# Patient Record
Sex: Female | Born: 1975 | Race: White | Hispanic: No | Marital: Married | Smoking: Former smoker
Health system: Southern US, Community
[De-identification: ages and names within clinical notes are randomized; demographics above are authoritative.]

## PROBLEM LIST (undated history)

## (undated) DIAGNOSIS — Z8619 Personal history of other infectious and parasitic diseases: Secondary | ICD-10-CM

## (undated) DIAGNOSIS — M419 Scoliosis, unspecified: Secondary | ICD-10-CM

## (undated) DIAGNOSIS — G43909 Migraine, unspecified, not intractable, without status migrainosus: Secondary | ICD-10-CM

## (undated) DIAGNOSIS — E785 Hyperlipidemia, unspecified: Secondary | ICD-10-CM

## (undated) DIAGNOSIS — F419 Anxiety disorder, unspecified: Secondary | ICD-10-CM

## (undated) HISTORY — DX: Scoliosis, unspecified: M41.9

## (undated) HISTORY — DX: Migraine, unspecified, not intractable, without status migrainosus: G43.909

## (undated) HISTORY — DX: Anxiety disorder, unspecified: F41.9

## (undated) HISTORY — PX: OTHER SURGICAL HISTORY: SHX169

## (undated) HISTORY — DX: Hyperlipidemia, unspecified: E78.5

## (undated) HISTORY — DX: Personal history of other infectious and parasitic diseases: Z86.19

---
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
## 2006-04-19 ENCOUNTER — Inpatient Hospital Stay (HOSPITAL_COMMUNITY): Admission: AD | Admit: 2006-04-19 | Discharge: 2006-04-19 | Payer: Self-pay | Admitting: Obstetrics & Gynecology

## 2006-05-01 ENCOUNTER — Inpatient Hospital Stay (HOSPITAL_COMMUNITY): Admission: AD | Admit: 2006-05-01 | Discharge: 2006-05-03 | Payer: Self-pay | Admitting: Obstetrics & Gynecology

## 2009-03-03 ENCOUNTER — Inpatient Hospital Stay (HOSPITAL_COMMUNITY): Admission: AD | Admit: 2009-03-03 | Discharge: 2009-03-04 | Payer: Self-pay | Admitting: Obstetrics & Gynecology

## 2010-10-17 NOTE — L&D Delivery Note (Signed)
Delivery Note At 5:21 PM a healthy female was delivered via  (Presentation: Occiput anterior)  APGAR: 10/10 ; weight 7 lb 3.7 oz (3280 g).   Placenta status: complete, 3v.  No Cx.  Cord pH: not done  Anesthesia: none Episiotomy: none Lacerations: Superficial Rt vulvar Suture Repair: vicryl Est. Blood Loss (mL): 300  Mom to postpartum.  Baby to nursery-stable.  Natalyn Szymanowski,MARIE-LYNE 08/31/2011, 5:49 PM

## 2011-01-25 LAB — RH IMMUNE GLOB WKUP(>/=20WKS)(NOT WOMEN'S HOSP)

## 2011-01-25 LAB — CBC
MCHC: 35.9 g/dL (ref 30.0–36.0)
MCV: 93.3 fL (ref 78.0–100.0)
Platelets: 157 10*3/uL (ref 150–400)
RDW: 13.4 % (ref 11.5–15.5)
WBC: 11.3 10*3/uL — ABNORMAL HIGH (ref 4.0–10.5)

## 2011-01-25 LAB — RPR: RPR Ser Ql: NONREACTIVE

## 2011-08-31 ENCOUNTER — Inpatient Hospital Stay (HOSPITAL_COMMUNITY)
Admission: AD | Admit: 2011-08-31 | Discharge: 2011-09-01 | DRG: 373 | Disposition: A | Discharge status: Home / Self Care | Payer: BC Managed Care – PPO | Source: Ambulatory Visit | Attending: Obstetrics & Gynecology | Admitting: Obstetrics & Gynecology

## 2011-08-31 ENCOUNTER — Encounter (HOSPITAL_COMMUNITY): Payer: Self-pay | Admitting: *Deleted

## 2011-08-31 LAB — CBC
HCT: 37.6 % (ref 36.0–46.0)
MCH: 30.9 pg (ref 26.0–34.0)
MCV: 90.2 fL (ref 78.0–100.0)
Platelets: 192 10*3/uL (ref 150–400)
RDW: 13.9 % (ref 11.5–15.5)
WBC: 10.6 10*3/uL — ABNORMAL HIGH (ref 4.0–10.5)

## 2011-08-31 LAB — STREP B DNA PROBE: GBS: NEGATIVE

## 2011-08-31 LAB — HIV ANTIBODY (ROUTINE TESTING W REFLEX): HIV: NONREACTIVE

## 2011-08-31 LAB — RUBELLA ANTIBODY, IGM: Rubella: IMMUNE

## 2011-08-31 MED ORDER — DIPHENHYDRAMINE HCL 25 MG PO CAPS: 25.0000 mg | ORAL_CAPSULE | Freq: Four times a day (QID) | ORAL | Status: DC | PRN

## 2011-08-31 MED ORDER — ZOLPIDEM TARTRATE 5 MG PO TABS: 5.0000 mg | ORAL_TABLET | Freq: Every evening | ORAL | Status: DC | PRN

## 2011-08-31 MED ORDER — IBUPROFEN 600 MG PO TABS
600.0000 mg | ORAL_TABLET | Freq: Four times a day (QID) | ORAL | Status: DC
  Administered 2011-08-31 – 2011-09-01 (×4): 600 mg via ORAL
  Filled 2011-08-31 (×4): qty 1

## 2011-08-31 MED ORDER — LIDOCAINE HCL (PF) 1 % IJ SOLN
INTRAMUSCULAR | Status: AC
  Filled 2011-08-31: qty 30

## 2011-08-31 MED ORDER — LACTATED RINGERS IV SOLN: INTRAVENOUS | Status: DC

## 2011-08-31 MED ORDER — LIDOCAINE HCL (PF) 1 % IJ SOLN: 30.0000 mL | INTRAMUSCULAR | Status: DC | PRN

## 2011-08-31 MED ORDER — SENNOSIDES-DOCUSATE SODIUM 8.6-50 MG PO TABS
2.0000 | ORAL_TABLET | Freq: Every day | ORAL | Status: DC
  Administered 2011-08-31: 2 via ORAL

## 2011-08-31 MED ORDER — WITCH HAZEL-GLYCERIN EX PADS: 1.0000 "application " | MEDICATED_PAD | CUTANEOUS | Status: DC | PRN

## 2011-08-31 MED ORDER — ONDANSETRON HCL 4 MG PO TABS: 4.0000 mg | ORAL_TABLET | ORAL | Status: DC | PRN

## 2011-08-31 MED ORDER — FLEET ENEMA 7-19 GM/118ML RE ENEM: 1.0000 | ENEMA | RECTAL | Status: DC | PRN

## 2011-08-31 MED ORDER — LACTATED RINGERS IV SOLN
500.0000 mL | INTRAVENOUS | Status: DC | PRN
Start: 2011-08-31 — End: 2011-08-31

## 2011-08-31 MED ORDER — LANOLIN HYDROUS EX OINT: TOPICAL_OINTMENT | CUTANEOUS | Status: DC | PRN

## 2011-08-31 MED ORDER — TERBUTALINE SULFATE 1 MG/ML IJ SOLN: 0.2500 mg | Freq: Once | INTRAMUSCULAR | Status: DC | PRN

## 2011-08-31 MED ORDER — CITRIC ACID-SODIUM CITRATE 334-500 MG/5ML PO SOLN: 30.0000 mL | ORAL | Status: DC | PRN

## 2011-08-31 MED ORDER — ONDANSETRON HCL 4 MG/2ML IJ SOLN: 4.0000 mg | INTRAMUSCULAR | Status: DC | PRN

## 2011-08-31 MED ORDER — OXYTOCIN 20 UNITS IN LACTATED RINGERS INFUSION - SIMPLE
INTRAVENOUS | Status: AC
  Filled 2011-08-31: qty 1000

## 2011-08-31 MED ORDER — PRENATAL PLUS 27-1 MG PO TABS
1.0000 | ORAL_TABLET | Freq: Every day | ORAL | Status: DC
  Administered 2011-09-01: 1 via ORAL
  Filled 2011-08-31: qty 1

## 2011-08-31 MED ORDER — OXYCODONE-ACETAMINOPHEN 5-325 MG PO TABS: 2.0000 | ORAL_TABLET | ORAL | Status: DC | PRN

## 2011-08-31 MED ORDER — ONDANSETRON HCL 4 MG/2ML IJ SOLN: 4.0000 mg | Freq: Four times a day (QID) | INTRAMUSCULAR | Status: DC | PRN

## 2011-08-31 MED ORDER — DIBUCAINE 1 % RE OINT: 1.0000 "application " | TOPICAL_OINTMENT | RECTAL | Status: DC | PRN

## 2011-08-31 MED ORDER — OXYTOCIN 20 UNITS IN LACTATED RINGERS INFUSION - SIMPLE
1.0000 m[IU]/min | INTRAVENOUS | Status: DC
  Administered 2011-08-31: 2 m[IU]/min via INTRAVENOUS

## 2011-08-31 MED ORDER — BENZOCAINE-MENTHOL 20-0.5 % EX AERO
1.0000 "application " | INHALATION_SPRAY | CUTANEOUS | Status: DC | PRN
  Administered 2011-08-31: 1 via TOPICAL

## 2011-08-31 MED ORDER — BENZOCAINE-MENTHOL 20-0.5 % EX AERO
INHALATION_SPRAY | CUTANEOUS | Status: AC
  Administered 2011-08-31: 1 via TOPICAL
  Filled 2011-08-31: qty 56

## 2011-08-31 MED ORDER — OXYTOCIN 20 UNITS IN LACTATED RINGERS INFUSION - SIMPLE: 125.0000 mL/h | Freq: Once | INTRAVENOUS | Status: DC

## 2011-08-31 MED ORDER — SIMETHICONE 80 MG PO CHEW: 80.0000 mg | CHEWABLE_TABLET | ORAL | Status: DC | PRN

## 2011-08-31 MED ORDER — OXYCODONE-ACETAMINOPHEN 5-325 MG PO TABS
1.0000 | ORAL_TABLET | ORAL | Status: DC | PRN
Start: 2011-08-31 — End: 2011-09-01
  Administered 2011-09-01 (×3): 1 via ORAL
  Filled 2011-08-31 (×3): qty 1

## 2011-08-31 MED ORDER — ACETAMINOPHEN 325 MG PO TABS: 650.0000 mg | ORAL_TABLET | ORAL | Status: DC | PRN

## 2011-08-31 MED ORDER — IBUPROFEN 600 MG PO TABS: 600.0000 mg | ORAL_TABLET | Freq: Four times a day (QID) | ORAL | Status: DC | PRN

## 2011-08-31 MED ORDER — TETANUS-DIPHTH-ACELL PERTUSSIS 5-2.5-18.5 LF-MCG/0.5 IM SUSP
0.5000 mL | Freq: Once | INTRAMUSCULAR | Status: AC
  Administered 2011-09-01: 0.5 mL via INTRAMUSCULAR
  Filled 2011-08-31: qty 0.5

## 2011-08-31 MED ORDER — OXYTOCIN BOLUS FROM INFUSION
500.0000 mL | Freq: Once | INTRAVENOUS | Status: DC
  Filled 2011-08-31: qty 500

## 2011-08-31 NOTE — Progress Notes (Signed)
Subjective: Doing well, pain tolerated, UCs q 3 min with Pito  Anesthesia none   Objective: BP 125/83  Pulse 105  Temp(Src) 98.6 F (37 C) (Oral)  Resp 18  Ht 5\' 3"  (1.6 m)  Wt 63.05 kg (139 lb)  BMI 24.62 kg/m2   FHT:  FHR: 140 bpm, variability: moderate,  accelerations:  Present,  decelerations:  Absent UC:   regular, every 3 minutes VE:   Dilation: 6 Effacement (%): 90 Station: 0 Exam by:: Montez Morita, RNC Reexamined now:  Unchanged.  AROM moderate clear AF.   Assessment / Plan: Induction of labor due to advanced dilatation with h/o expedite delivery and sciatalgia,  progressing well on pitocin  Fetal Wellbeing:  Category I Pain Control:  Labor support without medications  Anticipated MOD:  NSVD  Jessica Acosta,Jessica Acosta 08/31/2011, 4:53 PM

## 2011-08-31 NOTE — H&P (Signed)
Jessica Acosta is a 35 y.o. female G1P0 [redacted]w[redacted]d presenting for Induction.   RP:  Advanced cervical dilatation/h/o expedite delivery/sciatalgia  OB History    Grav Para Term Preterm Abortions TAB SAB Ect Mult Living   1              No past medical history on file. No past surgical history on file. Family History: family history is not on file. Social History:  does not have a smoking history on file. She does not have any smokeless tobacco history on file. Her alcohol and drug histories not on file. Current facility-administered medications:oxytocin (PITOCIN) IV infusion 20 units in LR 1000 mL, 1-40 milli-units/min, Intravenous, Titrated, [place] Bonna Steury;  terbutaline (BRETHINE) injection 0.25 mg, 0.25 mg, Subcutaneous, Once PRN, [place] Zae Kirtz No Known Allergies    Blood pressure 109/72, pulse 90, temperature 97.8 F (36.6 C), temperature source Oral, resp. rate 18, height 5\' 3"  (1.6 m), weight 63.05 kg (139 lb). VE 4 cm/90%/Vtx/0 intact  HPP: There is no problem list on file for this patient.   Prenatal labs: ABO, Rh: A/Negative/-- (11/14 0000) Antibody: Negative (11/14 0000) Rubella:   RPR: Nonreactive (11/14 0000)  HBsAg: Negative (11/14 0000)  HIV: Non-reactive (11/14 0000)  Genetic testing: Ultrascreen wnl [place] anato: wnl 1 hr GTT: wnl GBS: Negative (11/14 0000)   Assessment/Plan: 39+ wks induction low dose Pitocin for advanced dilatation/expedite delivery in G2/sciatalgia    Jessica Acosta,MARIE-LYNE 08/31/2011, 2:48 PM

## 2011-09-01 LAB — CBC
Platelets: 148 10*3/uL — ABNORMAL LOW (ref 150–400)
RBC: 3.48 MIL/uL — ABNORMAL LOW (ref 3.87–5.11)
WBC: 11.8 10*3/uL — ABNORMAL HIGH (ref 4.0–10.5)

## 2011-09-01 MED ORDER — IBUPROFEN 600 MG PO TABS: 600.0000 mg | ORAL_TABLET | Freq: Four times a day (QID) | ORAL | Status: AC

## 2011-09-01 MED ORDER — OXYCODONE-ACETAMINOPHEN 5-325 MG PO TABS: 1.0000 | ORAL_TABLET | ORAL | Status: AC | PRN

## 2011-09-01 NOTE — Discharge Summary (Signed)
Obstetric Discharge Summary Reason for Admission: onset of labor Prenatal Procedures: none Intrapartum Procedures: spontaneous vaginal delivery Postpartum Procedures: none Complications-Operative and Postpartum: none Hemoglobin  Date Value Range Status  09/01/2011 10.8* 12.0-15.0 (g/dL) Final     HCT  Date Value Range Status  09/01/2011 31.4* 36.0-46.0 (%) Final    Discharge Diagnoses: Term Pregnancy-delivered  Discharge Information: Date: 09/01/2011 Activity: pelvic rest Diet: routine Medications: PNV, Ibuprofen and Percocet Condition: stable Instructions: refer to practice specific booklet Discharge to: home Follow-up Information    Follow up with LAVOIE,MARIE-LYNE. Make an appointment in 6 weeks.   Contact information:   [address] [place] [address] [phone]          Newborn Data: Live born female  Birth Weight: 7 lb 3.7 oz (3280 g) APGAR: 9, 10  Home with mother.  Tiras Bianchini 09/01/2011, 9:52 AM

## 2011-09-01 NOTE — Progress Notes (Signed)
  PPD 1 SVD  S:  Reports feeling well / minimal cramps - desires early discharge today             Tolerating po/ No nausea or vomiting             Bleeding is light             Pain controlled withprescription NSAID's including motrin and percocet             Up ad lib / ambulatory  Newborn breast feeding  / female / Circumcision - not planned   O:  A & O x 3 NAD             VS: Blood pressure 95/54, pulse 78, temperature 98.3 F (36.8 C), temperature source Oral, resp. rate 18, height 5\' 3"  (1.6 m), weight 63.05 kg (139 lb), unknown if currently breastfeeding.  LABS: Lab Results  Component Value Date   WBC 11.8* 09/01/2011   HGB 10.8* 09/01/2011   HCT 31.4* 09/01/2011   MCV 90.2 09/01/2011   PLT 148* 09/01/2011     Lungs: Clear and unlabored  Heart: regular rate and rhythm / no mumurs  Abdomen: soft, non-tender, non-distended              Fundus: firm, non-tender, Ueven  Perineum: mild edema  Lochia: light  Extremities: no edema, no calf pain or tenderness    A: PPD # 1 SVD   Doing well - stable status  P:  Routine post partum orders  Early discharge home at 24 hours post-birth             Rhogam prior to discharge as indicated  Jessica Acosta 09/01/2011, 9:48 AM

## 2012-11-16 ENCOUNTER — Encounter: Payer: Self-pay | Admitting: Family Medicine

## 2012-11-16 ENCOUNTER — Ambulatory Visit (INDEPENDENT_AMBULATORY_CARE_PROVIDER_SITE_OTHER): Payer: BC Managed Care – PPO | Admitting: Family Medicine

## 2012-11-16 ENCOUNTER — Ambulatory Visit (INDEPENDENT_AMBULATORY_CARE_PROVIDER_SITE_OTHER)
Admission: RE | Admit: 2012-11-16 | Discharge: 2012-11-16 | Disposition: A | Discharge status: Home / Self Care | Payer: BC Managed Care – PPO | Source: Ambulatory Visit | Attending: Family Medicine | Admitting: Family Medicine

## 2012-11-16 VITALS — BP 118/80 | HR 62 | Temp 98.2°F | Ht 62.25 in | Wt 112.2 lb

## 2012-11-16 DIAGNOSIS — R109 Unspecified abdominal pain: Secondary | ICD-10-CM

## 2012-11-16 DIAGNOSIS — M545 Low back pain, unspecified: Secondary | ICD-10-CM

## 2012-11-16 DIAGNOSIS — Z23 Encounter for immunization: Secondary | ICD-10-CM

## 2012-11-16 LAB — COMPREHENSIVE METABOLIC PANEL
ALT: 18 U/L (ref 0–35)
AST: 12 U/L (ref 0–37)
Albumin: 4 g/dL (ref 3.5–5.2)
Alkaline Phosphatase: 32 U/L — ABNORMAL LOW (ref 39–117)
Calcium: 9 mg/dL (ref 8.4–10.5)
Chloride: 105 mEq/L (ref 96–112)
Creatinine, Ser: 0.7 mg/dL (ref 0.4–1.2)
Potassium: 4.4 mEq/L (ref 3.5–5.1)

## 2012-11-16 LAB — CBC WITH DIFFERENTIAL/PLATELET
Basophils Relative: 0.4 % (ref 0.0–3.0)
Eosinophils Absolute: 0 10*3/uL (ref 0.0–0.7)
Eosinophils Relative: 0.3 % (ref 0.0–5.0)
HCT: 38.5 % (ref 36.0–46.0)
Lymphs Abs: 2.5 10*3/uL (ref 0.7–4.0)
MCHC: 34.5 g/dL (ref 30.0–36.0)
MCV: 86.4 fl (ref 78.0–100.0)
Monocytes Absolute: 0.4 10*3/uL (ref 0.1–1.0)
Neutrophils Relative %: 57.6 % (ref 43.0–77.0)
RBC: 4.45 Mil/uL (ref 3.87–5.11)

## 2012-11-16 LAB — POCT URINALYSIS DIPSTICK
Bilirubin, UA: NEGATIVE
Glucose, UA: NEGATIVE
Leukocytes, UA: NEGATIVE
Nitrite, UA: NEGATIVE
Urobilinogen, UA: 0.2

## 2012-11-16 MED ORDER — NAPROXEN 500 MG PO TABS
ORAL_TABLET | ORAL | Status: DC
Start: 2012-11-16 — End: 2017-11-14

## 2012-11-16 NOTE — Assessment & Plan Note (Addendum)
With associated lower back pain UA - 1+ blood but micro with 0-2 RBC. ?kidney stones. With RUQ pain - ?gallstone issue. ?ovarian cyst. Check KUB today  Check blood work today to eval kidney function, liver, and white count. Treat pain with prescription NSAID for next week.  Benign exam today.  If any worsening, low threshold to obtain CT abd.

## 2012-11-16 NOTE — Patient Instructions (Addendum)
Flu shot today. I wonder if you have kidney stone issue versus gallstone. Treat with straining urine (strainer provided today) and anti inflammatory for pain as needed - take with food so it doesn't upset stomach. If not improving by next week, or if any worsening, let me know for CT scan. If spotting not improving, may need to return to Dr. Seymour Bars for recheck on new birth control pill. Good to meet you, call us with questions.

## 2012-11-16 NOTE — Progress Notes (Signed)
Subjective:    Patient ID: Jessica Acosta, female    DOB: 01-04-1976, 37 y.o.   MRN: [id number]  HPI CC: new pt to establish   Wife of [name]. OBGYN - Dr. Seymour Bars.  1 wk h/o lower back pain and mid abdominal pain   Lower back pain continues, described as continuous dull throbbing ache.  Last night with sharp stabbing pains in abdomen.  Pain not associated with BMs.  No pelvic pain.  Pain worse after eating large meal, but no specific food type aggravates this pain.  Mild nausea with this.  Some bloating associated with this.    H/o bad heartburn with pregnancies, controlled with tums.   No h/o kidney stones.  No fevers/chills, vomiting, diarrhea, constipation, indigestion, heartburn.  No urinary sxs of dysuria, urgency or frequency.  Normal BMs,  Hasn't tried anything for this.  H/o scoliosis On OCP - orsythia - for last 3 months.  Started spotting several days ago - thinks due to recent change in OCP (prior on mini-pill).  LMP 10/22/2012. 3 children at home, youngest child is 68 yo.  Both parents had gallbladder removed.  Lives with husband Latanja Lehenbauer and 3 children, 1 dog, 1 cat Occupation: elem school teacher Edu: master's degree Activity: no regular exercise Diet: good water, fruits/vegetables daily  Preventative: OBGYN - Dr. Seymour Bars.  Pap 10/19/2012.  H/o abnormal x1 around 1990s Tdap 2013 Flu - requests flu shot today.  Medications and allergies reviewed and updated in chart.  Past histories reviewed and updated if relevant as below. Patient Active Problem List  Diagnosis  . Postpartum care following vaginal delivery (11/14)   Past Medical History  Diagnosis Date  . History of chicken pox   . Scoliosis    Past Surgical History  Procedure Date  . Wisdom teeth removal   . Mole removed from ear 1987   History  Substance Use Topics  . Smoking status: Former Smoker    Quit date: 11/17/2003  . Smokeless tobacco: Never Used  . Alcohol Use: Yes   Comment: 2 glasses wine occasionally   Family History  Problem Relation Age of Onset  . Heart disease Father     pacer  . Hypertension Father   . Hyperlipidemia Father   . Arthritis Maternal Grandmother   . CAD Maternal Grandfather 51    MI (smoker)  . Diabetes Maternal Grandfather   . Cancer Paternal Grandmother     breast  . Stroke Paternal Grandmother   . Heart disease Paternal Grandmother     CHF  . Cancer Paternal Grandfather     prostate   No Known Allergies Current Outpatient Prescriptions on File Prior to Visit  Medication Sig Dispense Refill  . ORSYTHIA 0.1-20 MG-MCG tablet Take 1 tablet by mouth Daily.         Review of Systems  Constitutional: Negative for fever, chills, activity change, appetite change, fatigue and unexpected weight change.  HENT: Negative for hearing loss and neck pain.   Eyes: Negative for visual disturbance.  Respiratory: Negative for cough, shortness of breath and wheezing.   Cardiovascular: Negative for chest pain, palpitations and leg swelling.  Gastrointestinal: Positive for nausea and abdominal pain. Negative for vomiting, diarrhea, constipation, blood in stool and abdominal distention.  Genitourinary: Negative for hematuria and difficulty urinating.  Musculoskeletal: Negative for myalgias and arthralgias.  Skin: Negative for rash.  Neurological: Negative for dizziness, seizures, syncope and headaches.  Hematological: Does not bruise/bleed easily.  Psychiatric/Behavioral: Negative for dysphoric  mood. The patient is not nervous/anxious.        Objective:   Physical Exam  Nursing note and vitals reviewed. Constitutional: She is oriented to person, place, and time. She appears well-developed and well-nourished. No distress.  HENT:  Head: Normocephalic and atraumatic.  Right Ear: External ear normal.  Left Ear: External ear normal.  Nose: Nose normal.  Mouth/Throat: Oropharynx is clear and moist. No oropharyngeal exudate.  Eyes:  Conjunctivae normal and EOM are normal. Pupils are equal, round, and reactive to light. No scleral icterus.  Neck: Normal range of motion. Neck supple.  Cardiovascular: Normal rate, regular rhythm, normal heart sounds and intact distal pulses.   No murmur heard. Pulses:      Radial pulses are 2+ on the right side, and 2+ on the left side.  Pulmonary/Chest: Effort normal and breath sounds normal. No respiratory distress. She has no wheezes. She has no rales.  Abdominal: Soft. Normal appearance. She exhibits no distension and no mass. Bowel sounds are increased. There is no hepatosplenomegaly. There is tenderness (mild) in the right upper quadrant. There is no rigidity, no rebound, no guarding, no CVA tenderness and negative Murphy's sign. No hernia.       Mild RUQ discomfort to palpation  Musculoskeletal: Normal range of motion. She exhibits no edema.       Mild L lumbar paraspinous mm tightness No midline spine tenderness. Neg SLR bilaterally, neg faber, no pain with int/ext rotation at hips  Lymphadenopathy:    She has no cervical adenopathy.  Neurological: She is alert and oriented to person, place, and time.       CN grossly intact, station and gait intact  Skin: Skin is warm and dry. No rash noted.  Psychiatric: She has a normal mood and affect. Her behavior is normal. Judgment and thought content normal.       Assessment & Plan:

## 2012-11-19 ENCOUNTER — Encounter: Payer: Self-pay | Admitting: *Deleted

## 2012-11-26 ENCOUNTER — Telehealth: Payer: Self-pay

## 2012-11-26 NOTE — Telephone Encounter (Signed)
Pt called back and left v/m; pt doing better;pt took Aleve and when strained urine did see small pieces size of salt or sand in strainer(too small to have analyzed). Pt has no more abdominal or back pain.Please advise.

## 2012-11-26 NOTE — Telephone Encounter (Signed)
Pt left v/m recently saw Dr Reece Agar and was to call back with update on pt condition; pt said feeling better but request call back for pt to give more detail. Left v/m for pt to call back.

## 2012-11-27 NOTE — Telephone Encounter (Signed)
Noted. I'm glad she's feeling better. Would just monitor for now, push plenty of fluids and notify me if back pain returning.

## 2012-11-27 NOTE — Telephone Encounter (Signed)
Patient notified

## 2012-12-15 LAB — HM COLONOSCOPY: HM Colonoscopy: NORMAL

## 2013-01-06 ENCOUNTER — Encounter: Payer: Self-pay | Admitting: Family Medicine

## 2013-03-13 ENCOUNTER — Telehealth: Payer: Self-pay | Admitting: Family Medicine

## 2013-03-13 ENCOUNTER — Encounter: Payer: Self-pay | Admitting: Family Medicine

## 2013-03-13 ENCOUNTER — Ambulatory Visit (INDEPENDENT_AMBULATORY_CARE_PROVIDER_SITE_OTHER): Payer: BC Managed Care – PPO | Admitting: Family Medicine

## 2013-03-13 VITALS — BP 100/64 | HR 85 | Temp 98.1°F | Ht 62.25 in | Wt 116.0 lb

## 2013-03-13 DIAGNOSIS — J0100 Acute maxillary sinusitis, unspecified: Secondary | ICD-10-CM

## 2013-03-13 MED ORDER — AMOXICILLIN-POT CLAVULANATE 875-125 MG PO TABS: 1.0000 | ORAL_TABLET | Freq: Two times a day (BID) | ORAL | Status: DC

## 2013-03-13 NOTE — Telephone Encounter (Signed)
Patient Information:  Caller Name: [name]  Phone: [phone]  Patient: Jessica Acosta  Gender: Female  DOB: 11/10/1975  Age: 37 Years  PCP: Eustaquio Boyden [facility])  Pregnant: No  Office Follow Up:  Does the office need to follow up with this patient?: No  Instructions For The Office: N/A   Symptoms  Reason For Call & Symptoms: Pt states she has sinus pressure and congestion mostly oin the right side.  Reviewed Health History In EMR: Yes  Reviewed Medications In EMR: Yes  Reviewed Allergies In EMR: Yes  Reviewed Surgeries / Procedures: Yes  Date of Onset of Symptoms: 03/03/2013 OB / GYN:  LMP: 03/11/2013  Guideline(s) Used:  Sinus Pain and Congestion  Disposition Per Guideline:   Go to Office Now  Reason For Disposition Reached:   Redness or swelling on the cheek, forehead, or around the eye  Advice Given:  Call Back If:   You become worse.  Patient Will Follow Care Advice:  YES  Appointment Scheduled:  03/13/2013 14:30:00 Appointment Scheduled Provider:  Hannah Beat [facility])

## 2013-03-13 NOTE — Progress Notes (Signed)
   Nature conservation officer at [facility] [address] [place] [address] Phone: [phone] Fax: [phone]  Date:  03/13/2013   Name:  Jessica Acosta   DOB:  October 25, 1975   MRN:  [id number] Gender: female Age: 37 y.o.  Primary Physician:  Eustaquio Boyden, MD  Evaluating MD: Hannah Beat, MD  Chief Complaint: Facial Pain   History of Present Illness:  Jessica Acosta is a 37 y.o. very pleasant female patient who presents with the following:  Last weekend, started with some congestion and had a lot of stuffiness in her head. Felt miserable last Monday and got a fever, and all day on Tuesday and felt pretty bad. Tues evening, wanted to see how she was doing and broke on wed. Still had some congestion. Friday and Sat. Started to get achiness on her jaw.   Monday, was really bad and could hardly chew on the right side. Had a pulp cap put on, too.   Past Medical History, Surgical History, Social History, Family History, Problem List, Medications, and Allergies have been reviewed and updated if relevant.  Current Outpatient Prescriptions on File Prior to Visit  Medication Sig Dispense Refill  . naproxen (NAPROSYN) 500 MG tablet Take one po bid x 1 week then prn pain, take with food  60 tablet  0  . ORSYTHIA 0.1-20 MG-MCG tablet Take 1 tablet by mouth Daily.      . Probiotic Product (PROBIOTIC PO) Take by mouth. As directed       No current facility-administered medications on file prior to visit.    Review of Systems: ROS: GEN: Acute illness details above GI: Tolerating PO intake GU: maintaining adequate hydration and urination Pulm: No SOB Interactive and getting along well at home.  Otherwise, ROS is as per the HPI.   Physical Examination: BP 100/64  Pulse 85  Temp(Src) 98.1 F (36.7 C) (Oral)  Ht 5' 2.25" (1.581 m)  Wt 116 lb (52.617 kg)  BMI 21.05 kg/m2  SpO2 97%   Gen: WDWN, NAD; alert,appropriate and cooperative throughout exam  HEENT:  Normocephalic and atraumatic. Throat clear, w/o exudate, no LAD, R TM clear, L TM - good landmarks, No fluid present. rhinnorhea.  Left frontal and maxillary sinuses: nt, mild at l max Right frontal and maxillary sinuses: Tender at max  Neck: No ant or post LAD CV: RRR, No M/G/R Pulm: Breathing comfortably in no resp distress. no w/c/r Abd: S,NT,ND,+BS Extr: no c/c/e Psych: full affect, pleasant   Assessment and Plan:  Acute maxillary sinusitis Acute sinusitis: ABX as below. .  Reviewed symptomatic care as well as ABX in this case.     Orders Today:  No orders of the defined types were placed in this encounter.    Updated Medication List: (Includes new medications, updates to list, dose adjustments) Meds ordered this encounter  Medications  . amoxicillin-clavulanate (AUGMENTIN) 875-125 MG per tablet    Sig: Take 1 tablet by mouth 2 (two) times daily.    Dispense:  20 tablet    Refill:  0    Medications Discontinued: There are no discontinued medications.    Signed, Elpidio Galea. Curran Lenderman, MD 03/13/2013 2:43 PM

## 2013-11-28 ENCOUNTER — Encounter: Payer: Self-pay | Admitting: Family Medicine

## 2013-11-28 ENCOUNTER — Ambulatory Visit (INDEPENDENT_AMBULATORY_CARE_PROVIDER_SITE_OTHER): Payer: BC Managed Care – PPO | Admitting: Family Medicine

## 2013-11-28 VITALS — BP 118/78 | HR 86 | Temp 98.1°F | Ht 62.25 in | Wt 122.5 lb

## 2013-11-28 DIAGNOSIS — Z23 Encounter for immunization: Secondary | ICD-10-CM

## 2013-11-28 DIAGNOSIS — M25519 Pain in unspecified shoulder: Secondary | ICD-10-CM

## 2013-11-28 DIAGNOSIS — Z0000 Encounter for general adult medical examination without abnormal findings: Secondary | ICD-10-CM | POA: Insufficient documentation

## 2013-11-28 DIAGNOSIS — M25511 Pain in right shoulder: Secondary | ICD-10-CM

## 2013-11-28 NOTE — Progress Notes (Signed)
BP 118/78  Pulse 86  Temp(Src) 98.1 F (36.7 C) (Oral)  Ht 5' 2.25" (1.581 m)  Wt 122 lb 8 oz (55.566 kg)  BMI 22.23 kg/m2  LMP 10/22/2013   CC: annual exam, R shoulder pain  Subjective:    Patient ID: Jessica BakerKaren G Mcewen, female    DOB: 28-Aug-1976, 38 y.o.   MRN: [id number]  HPI: Jessica Acosta is a 38 y.o. female presenting on 11/28/2013 with Annual Exam and Shoulder Pain  R shoulder pain - Describes R shoulder pain not completely resolved with NSAID.  Ongoing for last month.  Worse with certain movements ie overhead.  Denies inciting trauma/falls.  Taking naprosyn 500mg  prn.  Some shooting pain down arm to hand.  Has tried icy hot, massage, ice, heating pad.  + h/o paresthesias of bilateral hands worse in the winter.  Lives with husband Lesleigh NoeRicky Blomgren and 3 children, 1 dog, 1 cat  Occupation: elem school teacher  Edu: master's degree  Activity: no regular exercise  Diet: good water, fruits/vegetables daily   Seat belt use discussed Sunscreen use discussed  Preventative:  OBGYN - Dr. Seymour BarsLavoie. Next appt 12/10/2012.  Last well woman was 1 yr ago.  H/o abnormal pap x1 around 1990s  LMP - spotting late last month.  Pregnancy test this week was normal. Tdap 2013  Flu - requests flu shot today.   Relevant past medical, surgical, family and social history reviewed and updated. Allergies and medications reviewed and updated. Current Outpatient Prescriptions on File Prior to Visit  Medication Sig  . naproxen (NAPROSYN) 500 MG tablet Take one po bid x 1 week then prn pain, take with food  . ORSYTHIA 0.1-20 MG-MCG tablet Take 1 tablet by mouth Daily.  . Probiotic Product (PROBIOTIC PO) Take by mouth. As directed   No current facility-administered medications on file prior to visit.    Review of Systems  Constitutional: Negative for fever, chills, activity change, appetite change, fatigue and unexpected weight change.  HENT: Negative for hearing loss.   Eyes: Negative for  visual disturbance.  Respiratory: Negative for cough, chest tightness, shortness of breath and wheezing.   Cardiovascular: Negative for chest pain, palpitations and leg swelling.  Gastrointestinal: Negative for nausea, vomiting, abdominal pain, diarrhea, constipation, blood in stool and abdominal distention.  Genitourinary: Negative for hematuria and difficulty urinating.  Musculoskeletal: Negative for arthralgias, myalgias and neck pain.  Skin: Negative for rash.  Neurological: Negative for dizziness, seizures, syncope and headaches.  Hematological: Negative for adenopathy. Does not bruise/bleed easily.  Psychiatric/Behavioral: Negative for dysphoric mood. The patient is nervous/anxious (mild).    Per HPI unless specifically indicated above    Objective:    BP 118/78  Pulse 86  Temp(Src) 98.1 F (36.7 C) (Oral)  Ht 5' 2.25" (1.581 m)  Wt 122 lb 8 oz (55.566 kg)  BMI 22.23 kg/m2  LMP 10/22/2013  Physical Exam  Nursing note and vitals reviewed. Constitutional: She is oriented to person, place, and time. She appears well-developed and well-nourished. No distress.  HENT:  Head: Normocephalic and atraumatic.  Right Ear: Hearing, tympanic membrane, external ear and ear canal normal.  Left Ear: Hearing, tympanic membrane, external ear and ear canal normal.  Nose: Nose normal.  Mouth/Throat: Uvula is midline, oropharynx is clear and moist and mucous membranes are normal. No oropharyngeal exudate, posterior oropharyngeal edema or posterior oropharyngeal erythema.  Eyes: Conjunctivae and EOM are normal. Pupils are equal, round, and reactive to light. No scleral icterus.  Neck: Normal  range of motion. Neck supple. No thyromegaly present.  Cardiovascular: Normal rate, regular rhythm, normal heart sounds and intact distal pulses.   No murmur heard. Pulses:      Radial pulses are 2+ on the right side, and 2+ on the left side.  Pulmonary/Chest: Effort normal and breath sounds normal. No  respiratory distress. She has no wheezes. She has no rales.  Abdominal: Soft. Bowel sounds are normal. She exhibits no distension and no mass. There is no tenderness. There is no rebound and no guarding.  Genitourinary:  Deferred - per OBGYN  Musculoskeletal: Normal range of motion. She exhibits no edema.  FROM at neck and shoulder. No midline pain at cervical spine.  No deformity, no pain to palpation of shoulders bilaterally No significant pain with int/ext rotation at shoulder + pain with empty can test on right. + impingement No significant pain with rotation of humeral head in GH joint. Neg speed test.  Lymphadenopathy:    She has no cervical adenopathy.  Neurological: She is alert and oriented to person, place, and time.  CN grossly intact, station and gait intact  Skin: Skin is warm and dry. No rash noted.  Psychiatric: She has a normal mood and affect. Her behavior is normal. Judgment and thought content normal.      Assessment & Plan:   Problem List Items Addressed This Visit   Health care maintenance - Primary     Preventative protocols reviewed and updated unless pt declined. Discussed healthy diet and lifestyle. FLP, BMP, and TSH today. Flu shot today. Pt wil schedule appt with OBGYN for well woman exam.    Relevant Orders      Lipid panel      Basic metabolic panel      TSH   Right shoulder pain     Anticipate rotator cuff syndrome with possible impingement Will treat with ibuprofen, stretching exercises from SM pt advisor, and ice/heat.  If not improved, will refer to PT and SM MD. Pt agrees with plan.        Follow up plan: Return if symptoms worsen or fail to improve.

## 2013-11-28 NOTE — Addendum Note (Signed)
Addended by: Josph MachoANCE, Nayelie Gionfriddo A on: 11/28/2013 05:09 PM   Modules accepted: Orders

## 2013-11-28 NOTE — Assessment & Plan Note (Signed)
Anticipate rotator cuff syndrome with possible impingement Will treat with ibuprofen, stretching exercises from SM pt advisor, and ice/heat.  If not improved, will refer to PT and SM MD. Pt agrees with plan.

## 2013-11-28 NOTE — Patient Instructions (Addendum)
Flu shot today. Look at raynaud's syndrome. Blood work today - fasting. For R shoulder - I think you have injury to your rotator cuff on the right leading to inflammation.  Start strengthening exercises (provided today) as well as continue ibuprofen 600-800mg  up to three times daily with meals as needed.  If not helping, let me know for referral to physical therapy and sports medicine doctor. Good to see you today, call us with questions.

## 2013-11-28 NOTE — Assessment & Plan Note (Signed)
Preventative protocols reviewed and updated unless pt declined. Discussed healthy diet and lifestyle. FLP, BMP, and TSH today. Flu shot today. Pt wil schedule appt with OBGYN for well woman exam.

## 2013-11-29 LAB — BASIC METABOLIC PANEL
BUN: 12 mg/dL (ref 6–23)
CHLORIDE: 103 meq/L (ref 96–112)
CO2: 23 mEq/L (ref 19–32)
CREATININE: 0.6 mg/dL (ref 0.4–1.2)
Calcium: 9.6 mg/dL (ref 8.4–10.5)
GFR: 113.67 mL/min (ref >60.00)
Glucose, Bld: 81 mg/dL (ref 70–99)
Potassium: 4.4 mEq/L (ref 3.5–5.1)
Sodium: 137 mEq/L (ref 135–145)

## 2013-11-29 LAB — LDL CHOLESTEROL, DIRECT: Direct LDL: 148.1 mg/dL

## 2013-11-29 LAB — TSH: TSH: 1.93 u[IU]/mL (ref 0.35–5.50)

## 2013-11-29 LAB — LIPID PANEL
CHOLESTEROL: 232 mg/dL — AB (ref 0–200)
HDL: 68.6 mg/dL (ref >39.00)
Total CHOL/HDL Ratio: 3
Triglycerides: 90 mg/dL (ref 0.0–149.0)
VLDL: 18 mg/dL (ref 0.0–40.0)

## 2013-12-02 ENCOUNTER — Encounter: Payer: Self-pay | Admitting: *Deleted

## 2014-08-18 ENCOUNTER — Encounter: Payer: Self-pay | Admitting: Family Medicine

## 2017-03-31 ENCOUNTER — Encounter: Payer: Self-pay | Admitting: Obstetrics & Gynecology

## 2017-04-05 ENCOUNTER — Encounter: Payer: Self-pay | Admitting: Obstetrics & Gynecology

## 2017-04-10 ENCOUNTER — Ambulatory Visit (INDEPENDENT_AMBULATORY_CARE_PROVIDER_SITE_OTHER): Payer: BC Managed Care – PPO | Admitting: Obstetrics & Gynecology

## 2017-04-10 ENCOUNTER — Encounter: Payer: Self-pay | Admitting: Obstetrics & Gynecology

## 2017-04-10 VITALS — BP 122/84 | Ht 62.5 in | Wt 131.0 lb

## 2017-04-10 DIAGNOSIS — Z01419 Encounter for gynecological examination (general) (routine) without abnormal findings: Secondary | ICD-10-CM

## 2017-04-10 DIAGNOSIS — Z3041 Encounter for surveillance of contraceptive pills: Secondary | ICD-10-CM

## 2017-04-10 DIAGNOSIS — Z803 Family history of malignant neoplasm of breast: Secondary | ICD-10-CM

## 2017-04-10 DIAGNOSIS — Z1151 Encounter for screening for human papillomavirus (HPV): Secondary | ICD-10-CM

## 2017-04-10 MED ORDER — LEVONORGESTREL-ETHINYL ESTRAD 0.1-20 MG-MCG PO TABS: 1.0000 | ORAL_TABLET | Freq: Every day | ORAL | 4 refills | Status: DC

## 2017-04-10 NOTE — Addendum Note (Signed)
Addended by: Berna SpareASTILLO, Reis Goga A on: 04/10/2017 11:14 AM   Modules accepted: Orders

## 2017-04-10 NOTE — Progress Notes (Signed)
JAMYIA FORTUNE 1975/10/24 [id number]   History:    41 y.o. G3P3 Married.  Son 31, daughter 48, son 31 yo.  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  RP:  Established patient presenting for annual gyn exam  Past medical history,surgical history, family history and social history were all reviewed and documented in the EPIC chart.  Gynecologic History Patient's last menstrual period was 04/04/2017. Contraception: OCP (estrogen/progesterone) Last Pap: 01/2015. Results were: normal/HPV HR neg. Last mammogram: Never, will start at 41 yo.  Obstetric History OB History  Gravida Para Term Preterm AB Living  3 3 3     3   SAB TAB Ectopic Multiple Live Births          1    # Outcome Date GA Lbr Len/2nd Weight Sex Delivery Anes PTL Lv  3 Term 08/31/11 [redacted]w[redacted]d 01:19 / 00:02 7 lb 3.7 oz (3.28 kg) M Vag-Spont Local  LIV     Birth Comments: no anamolies noted  2 Term           1 Term                ROS: A ROS was performed and pertinent positives and negatives are included in the history.  GENERAL: No fevers or chills. HEENT: No change in vision, no earache, sore throat or sinus congestion. NECK: No pain or stiffness. CARDIOVASCULAR: No chest pain or pressure. No palpitations. PULMONARY: No shortness of breath, cough or wheeze. GASTROINTESTINAL: No abdominal pain, nausea, vomiting or diarrhea, melena or bright red blood per rectum. GENITOURINARY: No urinary frequency, urgency, hesitancy or dysuria. MUSCULOSKELETAL: No joint or muscle pain, no back pain, no recent trauma. DERMATOLOGIC: No rash, no itching, no lesions. ENDOCRINE: No polyuria, polydipsia, no heat or cold intolerance. No recent change in weight. HEMATOLOGICAL: No anemia or easy bruising or bleeding. NEUROLOGIC: No headache, seizures, numbness, tingling or weakness. PSYCHIATRIC: No depression, no loss of interest in normal activity or change in sleep pattern.       Exam:   BP 122/84   Ht 5' 2.5" (1.588 m)   Wt 131 lb (59.4 kg)   LMP 04/04/2017   BMI 23.58 kg/m   Body mass index is 23.58 kg/m.  General appearance : Well developed well nourished female. No acute distress HEENT: Eyes: no retinal hemorrhage or exudates,  Neck supple, trachea midline, no carotid bruits, no thyroidmegaly Lungs: Clear to auscultation, no rhonchi or wheezes, or rib retractions  Heart: Regular rate and rhythm, no murmurs or gallops Breast:Examined in sitting and supine position were symmetrical in appearance, no palpable masses or tenderness,  no skin retraction, no nipple inversion, no nipple discharge, no skin discoloration, no axillary or supraclavicular lymphadenopathy Abdomen: no palpable masses or tenderness, no rebound or guarding Extremities: no edema or skin discoloration or tenderness  Pelvic:  Bartholin, Urethra, Skene Glands: Within normal limits             Vagina: No gross lesions or discharge  Cervix: No gross lesions or discharge.  Pap/HPV done.  Uterus  AV, normal size, shape and consistency, non-tender and mobile  Adnexa  Without masses or tenderness  Anus and perineum  normal    Assessment/Plan:  41 y.o. female for annual exam   1. Encounter for routine gynecological examination with Papanicolaou smear of cervix Normal gyn exam.  Pap/HPV done.  Breast exam normal.  Will schedule screening  mammo at 41 yo.  2. Encounter for surveillance of contraceptive pills Doing well on Orsythia 0.1/20, represcribed.    3. Family history of breast cancer Maternal Grand-mother 65(>80 yo) and 2 maternal grand-aunts with Breast Cancer, all after menopause.  No breast cancer for mother and her sisters.  No evidence of Familial/Genetic Breast Ca.  Genetic screening discussed and offered, but not recommended for patient at this time.  Counseling on above issue >50% x 10 minutes.  Genia DelMarie-Lyne Earle Troiano MD, 10:09 AM 04/10/2017

## 2017-04-10 NOTE — Patient Instructions (Signed)
1. Encounter for routine gynecological examination with Papanicolaou smear of cervix Normal gyn exam.  Pap/HPV done.  Breast exam normal.  Will schedule screening mammo at 41 yo.  2. Encounter for surveillance of contraceptive pills Doing well on Orsythia 0.1/20, represcribed.    3. Family history of breast cancer Maternal Grand-mother 59(>39 yo) and 2 maternal grand-aunts with Breast Cancer, all after menopause.  No breast cancer for mother and her sisters.  No evidence of Familial/Genetic Breast Ca.  Genetic screening discussed and offered, but not recommended for patient at this time.  It was a pleasure to see you today Jessica Acosta!  I will inform you of your results as soon as available.

## 2017-04-12 LAB — PAP, TP IMAGING W/ HPV RNA, RFLX HPV TYPE 16,18/45: HPV mRNA, High Risk: NOT DETECTED

## 2017-11-14 ENCOUNTER — Encounter: Payer: Self-pay | Admitting: Family Medicine

## 2017-11-14 ENCOUNTER — Ambulatory Visit: Payer: BC Managed Care – PPO | Admitting: Family Medicine

## 2017-11-14 VITALS — BP 134/82 | HR 88 | Temp 97.9°F | Wt 132.5 lb

## 2017-11-14 DIAGNOSIS — Z23 Encounter for immunization: Secondary | ICD-10-CM | POA: Diagnosis not present

## 2017-11-14 DIAGNOSIS — M25512 Pain in left shoulder: Secondary | ICD-10-CM

## 2017-11-14 DIAGNOSIS — R0789 Other chest pain: Secondary | ICD-10-CM | POA: Diagnosis not present

## 2017-11-14 DIAGNOSIS — R5383 Other fatigue: Secondary | ICD-10-CM | POA: Diagnosis not present

## 2017-11-14 MED ORDER — HYDROXYZINE HCL 25 MG PO TABS: 12.5000 mg | ORAL_TABLET | Freq: Two times a day (BID) | ORAL | 0 refills | Status: DC | PRN

## 2017-11-14 NOTE — Progress Notes (Addendum)
BP 134/82 (BP Location: Right Arm, Patient Position: Sitting, Cuff Size: Normal)   Pulse 88   Temp 97.9 F (36.6 C) (Oral)   Wt 132 lb 8 oz (60.1 kg)   LMP 10/19/2017   SpO2 98%   BMI 23.85 kg/m    CC: L shoulder pain, night sweats Subjective:    Patient ID: Jessica Acosta, female    DOB: November 15, 1975, 42 y.o.   MRN: [id number]  HPI: Jessica Acosta is a 42 y.o. female presenting on 11/14/2017 for Shoulder Pain (left shoulder pain radiates left chest. H/o chest pain) and Night Sweats (cold sweats at bedtime, irregular menstrual cycle, fatigue, nausea)   Last seen here 11/2013.   Some intermittent night sweats over last 2 months "drenched and cold". 1 wk h/o L shoulder pain not relieved with ibuprofen. Some radiation to anterior and posterior L chest. Discomfort not exertional, not relieved by rest. Some pleurisy. Some fatigue and nausea and ST last week - this has since resolved. She has been taking aspirin 13m daily since last week. Brings BP log - one elevated reading to 152/94, on repeat 120/80s. Ibuprofen didn't really help.   Denies fevers/chills, minimal productive cough of clear phlegm. No headache. No palpitations or dizziness.   LMP last month was irregular - slight bleed, not full cycle. Prior to this very regular. She did take pregnancy test 1 wk later - negative. No missed OCP. Due for period this week - has started spotting today.  Well woman with Jessica LDellis Filbertevery summer - normal pap smears. On Sronyx OCP regularly.  Ex smoker - quit 2005.   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 No significant fmhx cad.   H/o anxiety attacks, this feels different.   Relevant past medical, surgical, family and social history reviewed and updated as indicated. Interim medical history since our last visit reviewed. Allergies and medications reviewed and updated. Outpatient Medications Prior to Visit  Medication Sig Dispense Refill  .  levonorgestrel-ethinyl estradiol (ORSYTHIA) 0.1-20 MG-MCG tablet Take 1 tablet by mouth daily. 3 Package 4  . Multiple Vitamin (MULTIVITAMIN) tablet Take 1 tablet by mouth daily.    . Probiotic Product (PROBIOTIC PO) Take by mouth. As directed    . naproxen (NAPROSYN) 500 MG tablet Take one po bid x 1 week then prn pain, take with food 60 tablet 0  . Omega-3 Krill Oil 1000 MG CAPS Take 1 capsule by mouth daily.     No facility-administered medications prior to visit.      Per HPI unless specifically indicated in ROS section below Review of Systems     Objective:    BP 134/82 (BP Location: Right Arm, Patient Position: Sitting, Cuff Size: Normal)   Pulse 88   Temp 97.9 F (36.6 C) (Oral)   Wt 132 lb 8 oz (60.1 kg)   LMP 10/19/2017   SpO2 98%   BMI 23.85 kg/m   Wt Readings from Last 3 Encounters:  11/14/17 132 lb 8 oz (60.1 kg)  04/10/17 131 lb (59.4 kg)  11/28/13 122 lb 8 oz (55.6 kg)    Physical Exam  Constitutional: She is oriented to person, place, and time. She appears well-developed and well-nourished. No distress.  HENT:  Mouth/Throat: Oropharynx is clear and moist. No oropharyngeal exudate.  Cardiovascular: Normal rate, regular rhythm, normal heart sounds and intact distal pulses.  No murmur heard. Pulmonary/Chest: Effort normal and breath sounds normal. No respiratory distress. She has no wheezes.  She has no rales.  Abdominal: Soft. Bowel sounds are normal. She exhibits no distension and no mass. There is no tenderness. There is no rebound and no guarding.  Musculoskeletal: She exhibits no edema.  No midline cervical neck pain or rhomboid discomfort or trap discomfort FROM at cervical neck Bilateral shoulder exam: No deformity of shoulders on inspection. No pain with palpation of shoulder landmarks. FROM in abduction and forward flexion. No pain or weakness with testing SITS in ext/int rotation. No pain with empty can sign. Neg Yerguson, Speed test. No  impingement. No pain with crossover test. No pain with rotation of humeral head in Everest joint.   Neurological: She is alert and oriented to person, place, and time.  Skin: Skin is warm and dry. No rash noted. No erythema.  Psychiatric: She has a normal mood and affect.  Nursing note and vitals reviewed.  Results for orders placed or performed in visit on 04/10/17  PAP,TP IMGw/HPV RNA,rflx [id number],85/88  Result Value Ref Range   HPV mRNA, High Risk Not Detected    Specimen adequacy: SEE NOTE    FINAL DIAGNOSIS: SEE NOTE    COMMENTS: SEE NOTE    Cytotechnologist: SEE NOTE    EKG - NSR rate 70, normal axis, intervals, no acute ST/T changes.     Assessment & Plan:   Problem List Items Addressed This Visit    Acute pain of left shoulder - Primary    Unclear cause - benign MSK exam, no reproducible shoulder or chest wall or back pain.  Anticipate self limited MSK cause. Will monitor for now.       Chest discomfort    No significant fmhx CAD, no significant risk factors present. Doubt cardiac cause. ?anxiety/stress related.  Check EKG today - eval for predisposition for arrhythmia, etc.  Trial hydroxyzine PRN anxiety.       Relevant Orders   EKG 12-Lead (Completed)   Fatigue    This along with night sweats over last few months, overall non specific findings.  Again, unclear cause - evaluate with lab work including ESR, CBC. No weight loss.       Relevant Orders   Comprehensive metabolic panel   TSH   CBC with Differential/Platelet   Sedimentation rate    Other Visit Diagnoses    Need for influenza vaccination       Relevant Orders   Flu Vaccine QUAD 6+ mos PF IM (Fluarix Quad PF) (Completed)       Follow up plan: Return if symptoms worsen or fail to improve.  [name], MD

## 2017-11-14 NOTE — Assessment & Plan Note (Signed)
This along with night sweats over last few months, overall non specific findings.  Again, unclear cause - evaluate with lab work including ESR, CBC. No weight loss.

## 2017-11-14 NOTE — Assessment & Plan Note (Signed)
Unclear cause - benign MSK exam, no reproducible shoulder or chest wall or back pain.  Anticipate self limited MSK cause. Will monitor for now.

## 2017-11-14 NOTE — Patient Instructions (Addendum)
I'm not sure where these symptoms are coming from.  EKG today Labs today Trial hydroxyzine as needed for stress.  We will be in touch with results. Let us know how this medicine help.

## 2017-11-14 NOTE — Assessment & Plan Note (Addendum)
No significant fmhx CAD, no significant risk factors present. Doubt cardiac cause. ?anxiety/stress related.  Check EKG today - eval for predisposition for arrhythmia, etc.  Trial hydroxyzine PRN anxiety.

## 2017-11-15 ENCOUNTER — Other Ambulatory Visit: Payer: BC Managed Care – PPO

## 2017-11-15 DIAGNOSIS — R5383 Other fatigue: Secondary | ICD-10-CM

## 2017-11-15 LAB — CBC WITH DIFFERENTIAL/PLATELET
Basophils Absolute: 0.1 10*3/uL (ref 0.0–0.1)
Basophils Relative: 0.8 % (ref 0.0–3.0)
Eosinophils Absolute: 0 10*3/uL (ref 0.0–0.7)
Eosinophils Relative: 0.2 % (ref 0.0–5.0)
HCT: 41 % (ref 36.0–46.0)
HEMOGLOBIN: 13.8 g/dL (ref 12.0–15.0)
Lymphocytes Relative: 32.1 % (ref 12.0–46.0)
Lymphs Abs: 2.9 10*3/uL (ref 0.7–4.0)
MCHC: 33.7 g/dL (ref 30.0–36.0)
MCV: 87.5 fl (ref 78.0–100.0)
MONO ABS: 0.5 10*3/uL (ref 0.1–1.0)
Monocytes Relative: 6.1 % (ref 3.0–12.0)
NEUTROS ABS: 5.4 10*3/uL (ref 1.4–7.7)
Neutrophils Relative %: 60.8 % (ref 43.0–77.0)
Platelets: 320 10*3/uL (ref 150.0–400.0)
RBC: 4.68 Mil/uL (ref 3.87–5.11)
RDW: 12.8 % (ref 11.5–15.5)
WBC: 8.9 10*3/uL (ref 4.0–10.5)

## 2017-11-15 LAB — COMPREHENSIVE METABOLIC PANEL
ALT: 20 U/L (ref 0–35)
AST: 14 U/L (ref 0–37)
Albumin: 4.4 g/dL (ref 3.5–5.2)
Alkaline Phosphatase: 42 U/L (ref 39–117)
BUN: 13 mg/dL (ref 6–23)
CO2: 27 meq/L (ref 19–32)
Calcium: 9.5 mg/dL (ref 8.4–10.5)
Chloride: 101 mEq/L (ref 96–112)
Creatinine, Ser: 0.77 mg/dL (ref 0.40–1.20)
GFR: 88.28 mL/min (ref >60.00)
Glucose, Bld: 93 mg/dL (ref 70–99)
Potassium: 4.6 mEq/L (ref 3.5–5.1)
Sodium: 136 mEq/L (ref 135–145)
Total Bilirubin: 0.4 mg/dL (ref 0.2–1.2)
Total Protein: 7.5 g/dL (ref 6.0–8.3)

## 2017-11-15 LAB — TSH: TSH: 1.57 u[IU]/mL (ref 0.35–4.50)

## 2017-11-15 LAB — SEDIMENTATION RATE: Sed Rate: 6 mm/h (ref 0–20)

## 2018-04-12 ENCOUNTER — Ambulatory Visit: Payer: BC Managed Care – PPO | Admitting: Obstetrics & Gynecology

## 2018-04-12 ENCOUNTER — Encounter: Payer: Self-pay | Admitting: Obstetrics & Gynecology

## 2018-04-12 VITALS — BP 126/84 | Ht 62.25 in | Wt 133.6 lb

## 2018-04-12 DIAGNOSIS — Z3041 Encounter for surveillance of contraceptive pills: Secondary | ICD-10-CM | POA: Diagnosis not present

## 2018-04-12 DIAGNOSIS — N898 Other specified noninflammatory disorders of vagina: Secondary | ICD-10-CM | POA: Diagnosis not present

## 2018-04-12 DIAGNOSIS — Z01419 Encounter for gynecological examination (general) (routine) without abnormal findings: Secondary | ICD-10-CM

## 2018-04-12 LAB — WET PREP FOR TRICH, YEAST, CLUE

## 2018-04-12 MED ORDER — LEVONORGESTREL-ETHINYL ESTRAD 0.1-20 MG-MCG PO TABS: 1.0000 | ORAL_TABLET | Freq: Every day | ORAL | 4 refills | Status: DC

## 2018-04-12 NOTE — Patient Instructions (Signed)
1. Well female exam with routine gynecological exam Normal gynecologic exam.  Pap test negative with negative high-risk HPV in June 2018.  Will repeat Pap test at 2 to 3 years.  Breast exam normal.  Will schedule her first screening mammogram now.  Health labs with family physician.  Good body mass index at 24.24.  Continue with regular physical activity and healthy nutrition.  2. Encounter for surveillance of contraceptive pills Doing well on birth control pill levonorgestrel ethinyl estradiol 0.1/20.  No contraindication to birth control pill.  Same birth control pill prescribed.  3. Vaginal itching Normal gynecologic exam and negative wet prep.  Patient reassured.  Will use Monistat as needed if symptoms worsen. - WET PREP FOR TRICH, YEAST, CLUE  Other orders - levonorgestrel-ethinyl estradiol (ORSYTHIA) 0.1-20 MG-MCG tablet; Take 1 tablet by mouth daily.  Clydie BraunKaren, it was a pleasure seeing you today!

## 2018-04-12 NOTE — Progress Notes (Signed)
Jessica Acosta [birth year] [id number]   History:    42 y.o. G3P3L3 Married. 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  Son 17, daughter 41, son 65 yo.  RP:  Established patient presenting for annual gyn exam   HPI: Well on Orsythia 0.1/20.  No BTB.  No pelvic pain.  Normal vaginal secretions.  Very mild vaginal itching currently.  No pain with intercourse.  Urine and bowel movements normal.  Body mass index 24.24.  Physically active, including playing tennis regularly.  Breasts normal.  Health labs with family physician.  Past medical history,surgical history, family history and social history were all reviewed and documented in the EPIC chart.  Gynecologic History Patient's last menstrual period was 04/03/2018. Contraception: OCP (estrogen/progesterone) Last Pap: 03/2017. Results were: Negative, HPV HR neg Last mammogram: Never.  Will schedule now. Bone Density: Never Colonoscopy: Never  Obstetric History OB History  Gravida Para Term Preterm AB Living  3 3 3     3   SAB TAB Ectopic Multiple Live Births          1    # Outcome Date GA Lbr Len/2nd Weight Sex Delivery Anes PTL Lv  3 Term 08/31/11 [redacted]w[redacted]d 01:19 / 00:02 7 lb 3.7 oz (3.28 kg) M Vag-Spont Local  LIV     Birth Comments: no anamolies noted  2 Term           1 Term              ROS: A ROS was performed and pertinent positives and negatives are included in the history.  GENERAL: No fevers or chills. HEENT: No change in vision, no earache, sore throat or sinus congestion. NECK: No pain or stiffness. CARDIOVASCULAR: No chest pain or pressure. No palpitations. PULMONARY: No shortness of breath, cough or wheeze. GASTROINTESTINAL: No abdominal pain, nausea, vomiting or diarrhea, melena or bright red blood per rectum. GENITOURINARY: No urinary frequency, urgency, hesitancy or dysuria. MUSCULOSKELETAL: No joint or muscle pain, no back pain, no recent trauma. DERMATOLOGIC: No rash, no itching, no lesions. ENDOCRINE: No  polyuria, polydipsia, no heat or cold intolerance. No recent change in weight. HEMATOLOGICAL: No anemia or easy bruising or bleeding. NEUROLOGIC: No headache, seizures, numbness, tingling or weakness. PSYCHIATRIC: No depression, no loss of interest in normal activity or change in sleep pattern.     Exam:   BP 126/84   Ht 5' 2.25" (1.581 m)   Wt 133 lb 9.6 oz (60.6 kg)   LMP 04/03/2018   BMI 24.24 kg/m   Body mass index is 24.24 kg/m.  General appearance : Well developed well nourished female. No acute distress HEENT: Eyes: no retinal hemorrhage or exudates,  Neck supple, trachea midline, no carotid bruits, no thyroidmegaly Lungs: Clear to auscultation, no rhonchi or wheezes, or rib retractions  Heart: Regular rate and rhythm, no murmurs or gallops Breast:Examined in sitting and supine position were symmetrical in appearance, no palpable masses or tenderness,  no skin retraction, no nipple inversion, no nipple discharge, no skin discoloration, no axillary or supraclavicular lymphadenopathy Abdomen: no palpable masses or tenderness, no rebound or guarding Extremities: no edema or skin discoloration or tenderness  Pelvic: Vulva: Normal             Vagina: No gross lesions.  Mild increase in vaginal discharge.  Wet prep done.  Cervix: No gross lesions or discharge  Uterus  AV, normal size, shape and consistency, non-tender and mobile  Adnexa  Without masses or  tenderness  Anus: Normal  Wet prep: Negative   Assessment/Plan:  42 y.o. female for annual exam   1. Well female exam with routine gynecological exam Normal gynecologic exam.  Pap test negative with negative high-risk HPV in June 2018.  Will repeat Pap test at 2 to 3 years.  Breast exam normal.  Will schedule her first screening mammogram now.  Health labs with family physician.  Good body mass index at 24.24.  Continue with regular physical activity and healthy nutrition.  2. Encounter for surveillance of contraceptive  pills Doing well on birth control pill levonorgestrel ethinyl estradiol 0.1/20.  No contraindication to birth control pill.  Same birth control pill prescribed.  3. Vaginal itching Normal gynecologic exam and negative wet prep.  Patient reassured.  Will use Monistat as needed if symptoms worsen. - WET PREP FOR TRICH, YEAST, CLUE  Other orders - levonorgestrel-ethinyl estradiol (ORSYTHIA) 0.1-20 MG-MCG tablet; Take 1 tablet by mouth daily.  Genia DelMarie-Lyne Knoah Nedeau MD, 10:24 AM 04/12/2018

## 2019-04-12 ENCOUNTER — Other Ambulatory Visit: Payer: Self-pay

## 2019-04-15 ENCOUNTER — Other Ambulatory Visit: Payer: Self-pay

## 2019-04-15 ENCOUNTER — Encounter: Payer: Self-pay | Admitting: Obstetrics & Gynecology

## 2019-04-15 ENCOUNTER — Ambulatory Visit: Payer: BC Managed Care – PPO | Admitting: Obstetrics & Gynecology

## 2019-04-15 VITALS — BP 112/70 | Ht 62.0 in | Wt 132.8 lb

## 2019-04-15 DIAGNOSIS — Z01419 Encounter for gynecological examination (general) (routine) without abnormal findings: Secondary | ICD-10-CM

## 2019-04-15 DIAGNOSIS — Z3041 Encounter for surveillance of contraceptive pills: Secondary | ICD-10-CM | POA: Diagnosis not present

## 2019-04-15 MED ORDER — LEVONORGESTREL-ETHINYL ESTRAD 0.1-20 MG-MCG PO TABS: 1.0000 | ORAL_TABLET | Freq: Every day | ORAL | 4 refills | Status: DC

## 2019-04-15 NOTE — Patient Instructions (Signed)
1. Encounter for routine gynecological examination with Papanicolaou smear of cervix Normal gynecologic exam.  Pap reflex done.  Breast exam normal.  Will schedule her for screening mammogram now.  Health labs with family physician.  Good body mass index at 24.29.  Continue with fitness and healthy nutrition.  2. Encounter for surveillance of contraceptive pills Well on Orsythia 0.1/20.  No contraindication to continue.  Prescription sent to pharmacy.  Other orders - levonorgestrel-ethinyl estradiol (ORSYTHIA) 0.1-20 MG-MCG tablet; Take 1 tablet by mouth daily.  Jessica Acosta, it was a pleasure seeing you today!  I will inform you of your results as soon as they are available.

## 2019-04-15 NOTE — Addendum Note (Signed)
Addended by: Thurnell Garbe A on: 04/15/2019 11:38 AM   Modules accepted: Orders

## 2019-04-15 NOTE — Progress Notes (Signed)
Jessica BakerKaren G Acosta 1976/05/13 [id number]   History:    43 y.o. G3P3L3 Married.  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  Son 41 yo, daughter 41 yo, son 41 yo  RP:  Established patient presenting for annual gyn exam   HPI: Well on BCPs Orsythia 0.1/20.  No BTB.  No pelvic pain.  No pain with IC.  Urine/BMs normal.  Breasts normal.  BMI 24.29.  Good fitness and healthy nutrition.  Health labs with Fam MD.  Past medical history,surgical history, family history and social history were all reviewed and documented in the EPIC chart.  Gynecologic History Patient's last menstrual period was 04/03/2019. Contraception: OCP (estrogen/progesterone) Last Pap: 03/2017. Results were: Negative/HPV HR neg Last mammogram: Never, will schedule now.  Bone Density: Never Colonoscopy: Never  Obstetric History OB History  Gravida Para Term Preterm AB Living  3 3 3     3   SAB TAB Ectopic Multiple Live Births          1    # Outcome Date GA Lbr Len/2nd Weight Sex Delivery Anes PTL Lv  3 Term 08/31/11 [id number] 01:19 / 00:02 7 lb 3.7 oz (3.28 kg) M Vag-Spont Local  LIV     Birth Comments: no anamolies noted  2 Term           1 Term              ROS: A ROS was performed and pertinent positives and negatives are included in the history.  GENERAL: No fevers or chills. HEENT: No change in vision, no earache, sore throat or sinus congestion. NECK: No pain or stiffness. CARDIOVASCULAR: No chest pain or pressure. No palpitations. PULMONARY: No shortness of breath, cough or wheeze. GASTROINTESTINAL: No abdominal pain, nausea, vomiting or diarrhea, melena or bright red blood per rectum. GENITOURINARY: No urinary frequency, urgency, hesitancy or dysuria. MUSCULOSKELETAL: No joint or muscle pain, no back pain, no recent trauma. DERMATOLOGIC: No rash, no itching, no lesions. ENDOCRINE: No polyuria, polydipsia, no heat or cold intolerance. No recent change in weight. HEMATOLOGICAL: No anemia or easy bruising or bleeding. NEUROLOGIC: No  headache, seizures, numbness, tingling or weakness. PSYCHIATRIC: No depression, no loss of interest in normal activity or change in sleep pattern.     Exam:   BP 112/70   Ht 5\' 2"  (1.575 m)   Wt 132 lb 12.8 oz (60.2 kg)   LMP 04/03/2019 Comment: pill  BMI 24.29 kg/m   Body mass index is 24.29 kg/m.  General appearance : Well developed well nourished female. No acute distress HEENT: Eyes: no retinal hemorrhage or exudates,  Neck supple, trachea midline, no carotid bruits, no thyroidmegaly Lungs: Clear to auscultation, no rhonchi or wheezes, or rib retractions  Heart: Regular rate and rhythm, no murmurs or gallops Breast:Examined in sitting and supine position were symmetrical in appearance, no palpable masses or tenderness,  no skin retraction, no nipple inversion, no nipple discharge, no skin discoloration, no axillary or supraclavicular lymphadenopathy Abdomen: no palpable masses or tenderness, no rebound or guarding Extremities: no edema or skin discoloration or tenderness  Pelvic: Vulva: Normal             Vagina: No gross lesions or discharge  Cervix: No gross lesions or discharge.  Pap reflex done  Uterus  AV, normal size, shape and consistency, non-tender and mobile  Adnexa  Without masses or tenderness  Anus: Normal   Assessment/Plan:  43 y.o. female for annual exam   1. Encounter for routine  gynecological examination with Papanicolaou smear of cervix Normal gynecologic exam.  Pap reflex done.  Breast exam normal.  Will schedule her for screening mammogram now.  Health labs with family physician.  Good body mass index at 24.29.  Continue with fitness and healthy nutrition.  2. Encounter for surveillance of contraceptive pills Well on Orsythia 0.1/20.  No contraindication to continue.  Prescription sent to pharmacy.  Other orders - levonorgestrel-ethinyl estradiol (ORSYTHIA) 0.1-20 MG-MCG tablet; Take 1 tablet by mouth daily.  Princess Bruins MD, 10:50 AM 04/15/2019

## 2019-04-16 LAB — PAP IG W/ RFLX HPV ASCU

## 2019-04-26 ENCOUNTER — Encounter: Payer: Self-pay | Admitting: Family Medicine

## 2019-04-26 ENCOUNTER — Ambulatory Visit: Payer: BC Managed Care – PPO | Admitting: Family Medicine

## 2019-04-26 ENCOUNTER — Other Ambulatory Visit: Payer: Self-pay

## 2019-04-26 ENCOUNTER — Telehealth: Payer: Self-pay | Admitting: Family Medicine

## 2019-04-26 VITALS — BP 110/78 | HR 97 | Temp 99.5°F | Ht 62.0 in | Wt 131.5 lb

## 2019-04-26 DIAGNOSIS — R51 Headache: Secondary | ICD-10-CM | POA: Diagnosis not present

## 2019-04-26 DIAGNOSIS — R519 Headache, unspecified: Secondary | ICD-10-CM

## 2019-04-26 MED ORDER — AMOXICILLIN 500 MG PO CAPS: 1000.0000 mg | ORAL_CAPSULE | Freq: Two times a day (BID) | ORAL | 0 refills | Status: DC

## 2019-04-26 MED ORDER — PREDNISONE 20 MG PO TABS: ORAL_TABLET | ORAL | 0 refills | Status: DC

## 2019-04-26 NOTE — Telephone Encounter (Signed)
I spoke with pt; pt having jaw pain on rt side of face that radiates across top and lower teeth which started 04/22/19.pain level now 8 - 9. Pt had last sinus H/A on 04/20/19. Has pressure on rt side of face. Jaw hurts worse after eats. Pt was not able to rest last night due to pain.taking ibuprofen and decongestant. Pain does not go away. No drooping on face. No CP,SOB,H/A or dizziness. On 04/25/19 & 04/26/19 roof of mouth towards back near throat on rt side is sore to swallow.no other covid symptoms, no travel and no known exposure to covid. Pt will go to [facility] in [place].

## 2019-04-26 NOTE — Telephone Encounter (Signed)
Noted. Thanks.

## 2019-04-26 NOTE — Telephone Encounter (Signed)
Patient left a voicemail for Dr Darnell Level regarding symptoms she is having of jaw pain  Pt c/o jaw pain, pressure in face; denies fever.   Requests a call back to review her symptoms to see if she needs an OV here for go to [facility].

## 2019-04-26 NOTE — Progress Notes (Signed)
Chief Complaint  Patient presents with  . Jaw Pain  . Facial Pain    History of Present Illness: HPI   43 year old female patient of Dr. Synthia Innocent presents with new onset pain in jaw and face.  She  Reports 1 week ago.. sinus headache and pressure behnd eye.  Improved with sinus med.  Now in last 5 days.. new pain in right maxillary face, into teeth and into lower jaw.  No ear pain. No fever.   Focal tooth pain. NO sore throat.  no gum change.   Using ibuprofen 400-800 mg every 6-8 hours.  Yesterday tried sudafed... that helps a lot.  Has history of TMJ .. but no pain in TMJ  with chewing ( jaw does hurt).. has tried her mouthguard without relief.  Now has sore area on palate.   No fever, no congestion. COVID 19 screen No recent travel or known exposure to COVID19 The patient denies respiratory symptoms of COVID 19 at this time.  The importance of social distancing was discussed today.   Review of Systems  Constitutional: Negative for chills and fever.  HENT: Negative for congestion and ear pain.   Eyes: Negative for pain and redness.  Respiratory: Negative for cough and shortness of breath.   Cardiovascular: Negative for chest pain, palpitations and leg swelling.  Gastrointestinal: Negative for abdominal pain, blood in stool, constipation, diarrhea, nausea and vomiting.  Genitourinary: Negative for dysuria.  Musculoskeletal: Negative for falls and myalgias.  Skin: Negative for rash.  Neurological: Negative for dizziness.  Psychiatric/Behavioral: Negative for depression. The patient is not nervous/anxious.       Past Medical History:  Diagnosis Date  . History of chicken pox   . Scoliosis     reports that she quit smoking about 15 years ago. She has never used smokeless tobacco. She reports current alcohol use. She reports that she does not use drugs.   Current Outpatient Medications:  .  hydrOXYzine (ATARAX/VISTARIL) 25 MG tablet, Take 0.5-1 tablets (12.5-25 mg  total) by mouth 2 (two) times daily as needed for anxiety (sedation precautions)., Disp: 30 tablet, Rfl: 0 .  levonorgestrel-ethinyl estradiol (ORSYTHIA) 0.1-20 MG-MCG tablet, Take 1 tablet by mouth daily., Disp: 3 Package, Rfl: 4 .  Multiple Vitamin (MULTIVITAMIN) tablet, Take 1 tablet by mouth daily., Disp: , Rfl:  .  Omega-3 Krill Oil 1000 MG CAPS, Take 1 capsule by mouth daily., Disp: , Rfl:  .  Probiotic Product (PROBIOTIC PO), Take by mouth. As directed, Disp: , Rfl:    Observations/Objective: Blood pressure 110/78, pulse 97, temperature 99.5 F (37.5 C), temperature source Temporal, height 5\' 2"  (1.575 m), weight 131 lb 8 oz (59.6 kg), last menstrual period 04/03/2019, SpO2 98 %.  Physical Exam Constitutional:      General: She is not in acute distress.    Appearance: Normal appearance. She is well-developed. She is not ill-appearing or toxic-appearing.  HENT:     Head: Normocephalic.     Right Ear: Hearing, tympanic membrane, ear canal and external ear normal. Tympanic membrane is not erythematous, retracted or bulging.     Left Ear: Hearing, tympanic membrane, ear canal and external ear normal. Tympanic membrane is not erythematous, retracted or bulging.     Nose: No mucosal edema or rhinorrhea.     Right Sinus: No maxillary sinus tenderness or frontal sinus tenderness.     Left Sinus: No maxillary sinus tenderness or frontal sinus tenderness.     Mouth/Throat:  Pharynx: Uvula midline.  Eyes:     General: Lids are normal. Lids are everted, no foreign bodies appreciated.     Conjunctiva/sclera: Conjunctivae normal.     Pupils: Pupils are equal, round, and reactive to light.  Neck:     Musculoskeletal: Normal range of motion and neck supple.     Thyroid: No thyroid mass or thyromegaly.     Vascular: No carotid bruit.     Trachea: Trachea normal.  Cardiovascular:     Rate and Rhythm: Normal rate and regular rhythm.     Pulses: Normal pulses.     Heart sounds: Normal heart  sounds, S1 normal and S2 normal. No murmur. No friction rub. No gallop.   Pulmonary:     Effort: Pulmonary effort is normal. No tachypnea or respiratory distress.     Breath sounds: Normal breath sounds. No decreased breath sounds, wheezing, rhonchi or rales.  Abdominal:     General: Bowel sounds are normal.     Palpations: Abdomen is soft.     Tenderness: There is no abdominal tenderness.  Skin:    General: Skin is warm and dry.     Findings: No rash.  Neurological:     Mental Status: She is alert.  Psychiatric:        Mood and Affect: Mood is not anxious or depressed.        Speech: Speech normal.        Behavior: Behavior normal. Behavior is cooperative.        Thought Content: Thought content normal.        Judgment: Judgment normal.      Assessment and Plan   Facial pain Possible sinus infection vs allergic sinusitis... will treat with antibiotics and steroid taper. Continue nasal saline spray.    Kerby NoraAmy Marlissa Emerick, MD

## 2019-04-26 NOTE — Patient Instructions (Signed)
Complete antibiotics.  Complete prednisone taper.  Call if any fever or not improving as expected.

## 2019-06-03 DIAGNOSIS — R519 Headache, unspecified: Secondary | ICD-10-CM | POA: Insufficient documentation

## 2019-06-03 NOTE — Assessment & Plan Note (Signed)
Possible sinus infection vs allergic sinusitis... will treat with antibiotics and steroid taper. Continue nasal saline spray.

## 2019-07-02 ENCOUNTER — Other Ambulatory Visit: Payer: Self-pay | Admitting: Obstetrics & Gynecology

## 2020-04-03 ENCOUNTER — Other Ambulatory Visit: Payer: Self-pay | Admitting: Family Medicine

## 2020-04-03 DIAGNOSIS — Z1231 Encounter for screening mammogram for malignant neoplasm of breast: Secondary | ICD-10-CM

## 2020-04-14 ENCOUNTER — Other Ambulatory Visit: Payer: Self-pay

## 2020-04-15 ENCOUNTER — Ambulatory Visit: Payer: BC Managed Care – PPO | Admitting: Obstetrics & Gynecology

## 2020-04-15 ENCOUNTER — Encounter: Payer: Self-pay | Admitting: Obstetrics & Gynecology

## 2020-04-15 VITALS — BP 142/80 | Ht 62.0 in | Wt 138.0 lb

## 2020-04-15 DIAGNOSIS — Z3041 Encounter for surveillance of contraceptive pills: Secondary | ICD-10-CM

## 2020-04-15 DIAGNOSIS — Z01419 Encounter for gynecological examination (general) (routine) without abnormal findings: Secondary | ICD-10-CM | POA: Diagnosis not present

## 2020-04-15 MED ORDER — LEVONORGESTREL-ETHINYL ESTRAD 0.1-20 MG-MCG PO TABS: 1.0000 | ORAL_TABLET | Freq: Every day | ORAL | 4 refills | Status: DC

## 2020-04-15 NOTE — Progress Notes (Signed)
CYNITHA BERTE Mar 12, 1976 [id number]   History:    44 y.o. G3P3L3 Married.  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  Son 9 yo, daughter 92 yo, son 17 yo  RP:  Established patient presenting for annual gyn exam   HPI: Well on BCPs Orsythia 0.1/20.  No BTB.  No pelvic pain.  No pain with IC.  Urine/BMs normal.  Breasts normal.  BMI 25.24.  Good fitness and healthy nutrition.  Health labs with Fam MD.   Past medical history,surgical history, family history and social history were all reviewed and documented in the EPIC chart.  Gynecologic History Patient's last menstrual period was 04/01/2020.  Obstetric History OB History  Gravida Para Term Preterm AB Living  3 3 3     3   SAB TAB Ectopic Multiple Live Births          1    # Outcome Date GA Lbr Len/2nd Weight Sex Delivery Anes PTL Lv  3 Term 08/31/11 [redacted]w[redacted]d 01:19 / 00:02 7 lb 3.7 oz (3.28 kg) M Vag-Spont Local  LIV     Birth Comments: no anamolies noted  2 Term           1 Term              ROS: A ROS was performed and pertinent positives and negatives are included in the history.  GENERAL: No fevers or chills. HEENT: No change in vision, no earache, sore throat or sinus congestion. NECK: No pain or stiffness. CARDIOVASCULAR: No chest pain or pressure. No palpitations. PULMONARY: No shortness of breath, cough or wheeze. GASTROINTESTINAL: No abdominal pain, nausea, vomiting or diarrhea, melena or bright red blood per rectum. GENITOURINARY: No urinary frequency, urgency, hesitancy or dysuria. MUSCULOSKELETAL: No joint or muscle pain, no back pain, no recent trauma. DERMATOLOGIC: No rash, no itching, no lesions. ENDOCRINE: No polyuria, polydipsia, no heat or cold intolerance. No recent change in weight. HEMATOLOGICAL: No anemia or easy bruising or bleeding. NEUROLOGIC: No headache, seizures, numbness, tingling or weakness. PSYCHIATRIC: No depression, no loss of interest in normal activity or change in sleep pattern.     Exam:   BP (!)  142/80 (BP Location: Right Arm, Patient Position: Sitting, Cuff Size: Normal)   Ht 5\' 2"  (1.575 m)   Wt 138 lb (62.6 kg)   LMP 04/01/2020   BMI 25.24 kg/m   Body mass index is 25.24 kg/m.  General appearance : Well developed well nourished female. No acute distress HEENT: Eyes: no retinal hemorrhage or exudates,  Neck supple, trachea midline, no carotid bruits, no thyroidmegaly Lungs: Clear to auscultation, no rhonchi or wheezes, or rib retractions  Heart: Regular rate and rhythm, no murmurs or gallops Breast:Examined in sitting and supine position were symmetrical in appearance, no palpable masses or tenderness,  no skin retraction, no nipple inversion, no nipple discharge, no skin discoloration, no axillary or supraclavicular lymphadenopathy Abdomen: no palpable masses or tenderness, no rebound or guarding Extremities: no edema or skin discoloration or tenderness  Pelvic: Vulva: Normal             Vagina: No gross lesions or discharge  Cervix: No gross lesions or discharge  Uterus  AV, normal size, shape and consistency, non-tender and mobile  Adnexa  Without masses or tenderness  Anus: Normal   Assessment/Plan:  44 y.o. female for annual exam   1. Well female exam with routine gynecological exam Normal gynecologic exam.  No indication to perform a Pap test this year.  Breast exam normal.  Will schedule a screening mammogram now.  Health labs with family physician.  Good body mass index at 25.24.  Continue with healthy nutrition and fitness.  2. Encounter for surveillance of contraceptive pills Well on levonorgestrel ethinyl estradiol 0.1/20.  No contraindication to continue on low-dose birth control pills.  Prescription sent to pharmacy.  Other orders - levonorgestrel-ethinyl estradiol (SRONYX) 0.1-20 MG-MCG tablet; Take 1 tablet by mouth daily.  Genia Del MD, 10:41 AM 04/15/2020

## 2020-04-18 ENCOUNTER — Encounter: Payer: Self-pay | Admitting: Obstetrics & Gynecology

## 2020-04-18 NOTE — Patient Instructions (Signed)
1. Well female exam with routine gynecological exam Normal gynecologic exam.  No indication to perform a Pap test this year.  Breast exam normal.  Will schedule a screening mammogram now.  Health labs with family physician.  Good body mass index at 25.24.  Continue with healthy nutrition and fitness.  2. Encounter for surveillance of contraceptive pills Well on levonorgestrel ethinyl estradiol 0.1/20.  No contraindication to continue on low-dose birth control pills.  Prescription sent to pharmacy.  Other orders - levonorgestrel-ethinyl estradiol (SRONYX) 0.1-20 MG-MCG tablet; Take 1 tablet by mouth daily.  Memorie, it was a pleasure seeing you today!

## 2020-04-20 ENCOUNTER — Other Ambulatory Visit: Payer: Self-pay | Admitting: Family Medicine

## 2020-04-20 DIAGNOSIS — E785 Hyperlipidemia, unspecified: Secondary | ICD-10-CM

## 2020-04-21 ENCOUNTER — Other Ambulatory Visit: Payer: Self-pay

## 2020-04-21 ENCOUNTER — Ambulatory Visit
Admission: RE | Admit: 2020-04-21 | Discharge: 2020-04-21 | Disposition: A | Discharge status: Home / Self Care | Payer: BC Managed Care – PPO | Source: Ambulatory Visit | Attending: Family Medicine | Admitting: Family Medicine

## 2020-04-21 DIAGNOSIS — Z1231 Encounter for screening mammogram for malignant neoplasm of breast: Secondary | ICD-10-CM

## 2020-04-22 ENCOUNTER — Other Ambulatory Visit: Payer: Self-pay

## 2020-04-22 ENCOUNTER — Other Ambulatory Visit (INDEPENDENT_AMBULATORY_CARE_PROVIDER_SITE_OTHER): Payer: BC Managed Care – PPO

## 2020-04-22 DIAGNOSIS — E785 Hyperlipidemia, unspecified: Secondary | ICD-10-CM | POA: Diagnosis not present

## 2020-04-22 LAB — COMPREHENSIVE METABOLIC PANEL
ALT: 16 U/L (ref 0–35)
AST: 11 U/L (ref 0–37)
Albumin: 4.3 g/dL (ref 3.5–5.2)
Alkaline Phosphatase: 50 U/L (ref 39–117)
BUN: 10 mg/dL (ref 6–23)
CO2: 27 mEq/L (ref 19–32)
Calcium: 9.3 mg/dL (ref 8.4–10.5)
Chloride: 103 mEq/L (ref 96–112)
Creatinine, Ser: 0.74 mg/dL (ref 0.40–1.20)
GFR: 85.92 mL/min (ref >60.00)
Glucose, Bld: 87 mg/dL (ref 70–99)
Potassium: 4.3 mEq/L (ref 3.5–5.1)
Sodium: 137 mEq/L (ref 135–145)
Total Bilirubin: 0.5 mg/dL (ref 0.2–1.2)
Total Protein: 6.7 g/dL (ref 6.0–8.3)

## 2020-04-22 LAB — LIPID PANEL
Cholesterol: 235 mg/dL — ABNORMAL HIGH (ref 0–200)
HDL: 66.3 mg/dL (ref >39.00)
LDL Cholesterol: 141 mg/dL — ABNORMAL HIGH (ref 0–99)
NonHDL: 169.04
Total CHOL/HDL Ratio: 4
Triglycerides: 142 mg/dL (ref 0.0–149.0)
VLDL: 28.4 mg/dL (ref 0.0–40.0)

## 2020-04-22 LAB — HM MAMMOGRAPHY

## 2020-04-22 LAB — TSH: TSH: 1.43 u[IU]/mL (ref 0.35–4.50)

## 2020-04-23 ENCOUNTER — Encounter: Payer: Self-pay | Admitting: Family Medicine

## 2020-05-04 ENCOUNTER — Ambulatory Visit (INDEPENDENT_AMBULATORY_CARE_PROVIDER_SITE_OTHER): Payer: BC Managed Care – PPO | Admitting: Family Medicine

## 2020-05-04 ENCOUNTER — Encounter: Payer: Self-pay | Admitting: Family Medicine

## 2020-05-04 ENCOUNTER — Other Ambulatory Visit: Payer: Self-pay

## 2020-05-04 VITALS — BP 120/82 | HR 80 | Temp 97.9°F | Ht 61.75 in | Wt 140.4 lb

## 2020-05-04 DIAGNOSIS — E785 Hyperlipidemia, unspecified: Secondary | ICD-10-CM | POA: Insufficient documentation

## 2020-05-04 DIAGNOSIS — E7800 Pure hypercholesterolemia, unspecified: Secondary | ICD-10-CM

## 2020-05-04 DIAGNOSIS — G43829 Menstrual migraine, not intractable, without status migrainosus: Secondary | ICD-10-CM | POA: Diagnosis not present

## 2020-05-04 DIAGNOSIS — Z0000 Encounter for general adult medical examination without abnormal findings: Secondary | ICD-10-CM

## 2020-05-04 NOTE — Progress Notes (Signed)
This visit was conducted in person.  BP 120/82 (BP Location: Left Arm, Patient Position: Sitting, Cuff Size: Normal)   Pulse 80   Temp 97.9 F (36.6 C) (Temporal)   Ht 5' 1.75" (1.568 m)   Wt 140 lb 6 oz (63.7 kg)   LMP 04/29/2020   SpO2 99%   BMI 25.88 kg/m    CC: CPE Subjective:    Patient ID: Jessica Acosta, female    DOB: 1976/01/26, 44 y.o.   MRN: [id number]  HPI: Jessica Acosta is a 44 y.o. female presenting on 05/04/2020 for Annual Exam   Rough year - lots of illness and death in family.  Continues playing tennis regularly.   Preventative: Well woman yearly with OBGYN Dr. Seymour Bars, last seen 03/2020. Recently all normal paps, latest pap 03/2019.  Mammogram 04/2020 [facility] (1st mammo) LMP - 04/29/2020, regular, monthly on OCP.  Flu - yearly  Tdap 2012 COVID vaccine - completed Pfizer series 12/2019  Seat belt use discussed Sunscreen use discussed. No changing moles on skin Ex-smoker - quit 2005 Alcohol - on weekends Dentist q6 mo Eye exam yearly  Lives with husband Jessica Acosta and 3 children, 1 dog, 1 cat  Occupation: Paramedic - curriculum facilitator  Edu: master's degree  Activity: plays tennis weekly  Diet: good water, fruits/vegetables daily  1 cup coffee in am     Relevant past medical, surgical, family and social history reviewed and updated as indicated. Interim medical history since our last visit reviewed. Allergies and medications reviewed and updated. Outpatient Medications Prior to Visit  Medication Sig Dispense Refill  . levonorgestrel-ethinyl estradiol (SRONYX) 0.1-20 MG-MCG tablet Take 1 tablet by mouth daily. 84 tablet 4  . Multiple Vitamin (MULTIVITAMIN) tablet Take 1 tablet by mouth daily.    . Omega-3 Krill Oil 1000 MG CAPS Take 1 capsule by mouth daily.    . Probiotic Product (PROBIOTIC PO) Take by mouth. As directed    . hydrOXYzine (ATARAX/VISTARIL) 25 MG tablet Take 0.5-1 tablets (12.5-25 mg total) by mouth  2 (two) times daily as needed for anxiety (sedation precautions). 30 tablet 0   No facility-administered medications prior to visit.     Per HPI unless specifically indicated in ROS section below Review of Systems  Constitutional: Negative for activity change, appetite change, chills, fatigue, fever and unexpected weight change.  HENT: Negative for hearing loss.   Eyes: Negative for visual disturbance.  Respiratory: Negative for cough, chest tightness, shortness of breath and wheezing.   Cardiovascular: Negative for chest pain, palpitations and leg swelling.  Gastrointestinal: Negative for abdominal distention, abdominal pain, blood in stool, constipation, diarrhea, nausea and vomiting.  Genitourinary: Negative for difficulty urinating and hematuria.  Musculoskeletal: Negative for arthralgias, myalgias and neck pain.  Skin: Negative for rash.  Neurological: Positive for headaches (along with cycles, managed with OTC excedrin - nausea, photo/phonophobia, activity limiting - describes pressure behind eyes, R>L, about 1-2 around cycle). Negative for dizziness, seizures and syncope.  Hematological: Negative for adenopathy. Does not bruise/bleed easily.  Psychiatric/Behavioral: Negative for dysphoric mood. The patient is not nervous/anxious.    Objective:  BP 120/82 (BP Location: Left Arm, Patient Position: Sitting, Cuff Size: Normal)   Pulse 80   Temp 97.9 F (36.6 C) (Temporal)   Ht 5' 1.75" (1.568 m)   Wt 140 lb 6 oz (63.7 kg)   LMP 04/29/2020   SpO2 99%   BMI 25.88 kg/m   Wt Readings from Last 3 Encounters:  05/04/20 140 lb 6 oz (63.7 kg)  04/15/20 138 lb (62.6 kg)  04/26/19 131 lb 8 oz (59.6 kg)      Physical Exam Vitals and nursing note reviewed.  Constitutional:      General: She is not in acute distress.    Appearance: Normal appearance. She is well-developed. She is not ill-appearing.  HENT:     Head: Normocephalic and atraumatic.     Right Ear: Hearing, tympanic  membrane, ear canal and external ear normal.     Left Ear: Hearing, tympanic membrane, ear canal and external ear normal.  Eyes:     General: No scleral icterus.    Conjunctiva/sclera: Conjunctivae normal.     Pupils: Pupils are equal, round, and reactive to light.  Cardiovascular:     Rate and Rhythm: Normal rate and regular rhythm.     Pulses: Normal pulses.          Radial pulses are 2+ on the right side and 2+ on the left side.     Heart sounds: Normal heart sounds. No murmur heard.   Pulmonary:     Effort: Pulmonary effort is normal. No respiratory distress.     Breath sounds: Normal breath sounds. No wheezing, rhonchi or rales.  Abdominal:     General: Abdomen is flat. Bowel sounds are normal. There is no distension.     Palpations: Abdomen is soft. There is no mass.     Tenderness: There is no abdominal tenderness. There is no guarding or rebound.     Hernia: No hernia is present.  Musculoskeletal:        General: Normal range of motion.     Cervical back: Normal range of motion and neck supple.     Right lower leg: No edema.     Left lower leg: No edema.  Lymphadenopathy:     Cervical: No cervical adenopathy.  Skin:    General: Skin is warm and dry.     Findings: No rash.  Neurological:     General: No focal deficit present.     Mental Status: She is alert and oriented to person, place, and time.     Comments: CN grossly intact, station and gait intact  Psychiatric:        Mood and Affect: Mood normal.        Behavior: Behavior normal.        Thought Content: Thought content normal.        Judgment: Judgment normal.       Results for orders placed or performed in visit on 04/23/20  [facility] MAMMOGRAPHY  Result Value Ref Range   HM Mammogram 0-4 Bi-Rad 0-4 Bi-Rad, Self Reported Normal   Depression screen [facility] 2/9 05/04/2020  Decreased Interest 0  Down, Depressed, Hopeless 0  PHQ - 2 Score 0   No flowsheet data found.  Assessment & Plan:  This visit occurred during  the SARS-CoV-2 public health emergency.  Safety protocols were in place, including screening questions prior to the visit, additional usage of staff PPE, and extensive cleaning of exam room while observing appropriate contact time as indicated for disinfecting solutions.   Problem List Items Addressed This Visit    Menstrual migraine    Describes menstrual migraine managed with PRN excedrin.       Hyperlipidemia    Mild, off meds. Reviewed diet changes to improve LDL The 10-year ASCVD risk score Denman George DC Jr., et al., 2013) is: 0.6%   Values used to  calculate the score:     Age: 17 years     Sex: Female     Is Non-Hispanic African American: No     Diabetic: No     Tobacco smoker: No     Systolic Blood Pressure: 120 mmHg     Is BP treated: No     HDL Cholesterol: 66.3 mg/dL     Total Cholesterol: 235 mg/dL       Health care maintenance - Primary    Preventative protocols reviewed and updated unless pt declined. Discussed healthy diet and lifestyle.           No orders of the defined types were placed in this encounter.  No orders of the defined types were placed in this encounter.   Patient instructions: You are doing well today Continue healthy diet and regular exercise routine  Increase fiber and legumes to help lower LDL bad cholesterol. Return as needed or in 1-2 years for next physical.   Follow up plan: Return in about 1 year (around 05/04/2021) for annual exam, prior fasting for blood work.  Eustaquio Boyden, MD

## 2020-05-04 NOTE — Assessment & Plan Note (Signed)
Preventative protocols reviewed and updated unless pt declined. Discussed healthy diet and lifestyle.  

## 2020-05-04 NOTE — Assessment & Plan Note (Signed)
Mild, off meds. Reviewed diet changes to improve LDL The 10-year ASCVD risk score Denman George DC Montez Hageman., et al., 2013) is: 0.6%   Values used to calculate the score:     Age: 44 years     Sex: Female     Is Non-Hispanic African American: No     Diabetic: No     Tobacco smoker: No     Systolic Blood Pressure: 120 mmHg     Is BP treated: No     HDL Cholesterol: 66.3 mg/dL     Total Cholesterol: 235 mg/dL

## 2020-05-04 NOTE — Patient Instructions (Signed)
You are doing well today Continue healthy diet and regular exercise routine  Increase fiber and legumes to help lower LDL bad cholesterol. Return as needed or in 1-2 years for next physical.   Health Maintenance, Female Adopting a healthy lifestyle and getting preventive care are important in promoting health and wellness. Ask your health care provider about:  The right schedule for you to have regular tests and exams.  Things you can do on your own to prevent diseases and keep yourself healthy. What should I know about diet, weight, and exercise? Eat a healthy diet   Eat a diet that includes plenty of vegetables, fruits, low-fat dairy products, and lean protein.  Do not eat a lot of foods that are high in solid fats, added sugars, or sodium. Maintain a healthy weight Body mass index (BMI) is used to identify weight problems. It estimates body fat based on height and weight. Your health care provider can help determine your BMI and help you achieve or maintain a healthy weight. Get regular exercise Get regular exercise. This is one of the most important things you can do for your health. Most adults should:  Exercise for at least 150 minutes each week. The exercise should increase your heart rate and make you sweat (moderate-intensity exercise).  Do strengthening exercises at least twice a week. This is in addition to the moderate-intensity exercise.  Spend less time sitting. Even light physical activity can be beneficial. Watch cholesterol and blood lipids Have your blood tested for lipids and cholesterol at 42 years of age, then have this test every 5 years. Have your cholesterol levels checked more often if:  Your lipid or cholesterol levels are high.  You are older than 42 years of age.  You are at high risk for heart disease. What should I know about cancer screening? Depending on your health history and family history, you may need to have cancer screening at various ages.  This may include screening for:  Breast cancer.  Cervical cancer.  Colorectal cancer.  Skin cancer.  Lung cancer. What should I know about heart disease, diabetes, and high blood pressure? Blood pressure and heart disease  High blood pressure causes heart disease and increases the risk of stroke. This is more likely to develop in people who have high blood pressure readings, are of African descent, or are overweight.  Have your blood pressure checked: ? Every 3-5 years if you are 42-60 years of age. ? Every year if you are 20 years old or older. Diabetes Have regular diabetes screenings. This checks your fasting blood sugar level. Have the screening done:  Once every three years after age 72 if you are at a normal weight and have a low risk for diabetes.  More often and at a younger age if you are overweight or have a high risk for diabetes. What should I know about preventing infection? Hepatitis B If you have a higher risk for hepatitis B, you should be screened for this virus. Talk with your health care provider to find out if you are at risk for hepatitis B infection. Hepatitis C Testing is recommended for:  Everyone born from [birth year] through 1965.  Anyone with known risk factors for hepatitis C. Sexually transmitted infections (STIs)  Get screened for STIs, including gonorrhea and chlamydia, if: ? You are sexually active and are younger than 42 years of age. ? You are older than 42 years of age and your health care provider tells you that you  are at risk for this type of infection. ? Your sexual activity has changed since you were last screened, and you are at increased risk for chlamydia or gonorrhea. Ask your health care provider if you are at risk.  Ask your health care provider about whether you are at high risk for HIV. Your health care provider may recommend a prescription medicine to help prevent HIV infection. If you choose to take medicine to prevent HIV, you  should first get tested for HIV. You should then be tested every 3 months for as long as you are taking the medicine. Pregnancy  If you are about to stop having your period (premenopausal) and you may become pregnant, seek counseling before you get pregnant.  Take 400 to 800 micrograms (mcg) of folic acid every day if you become pregnant.  Ask for birth control (contraception) if you want to prevent pregnancy. Osteoporosis and menopause Osteoporosis is a disease in which the bones lose minerals and strength with aging. This can result in bone fractures. If you are 42 years old or older, or if you are at risk for osteoporosis and fractures, ask your health care provider if you should:  Be screened for bone loss.  Take a calcium or vitamin D supplement to lower your risk of fractures.  Be given hormone replacement therapy (HRT) to treat symptoms of menopause. Follow these instructions at home: Lifestyle  Do not use any products that contain nicotine or tobacco, such as cigarettes, e-cigarettes, and chewing tobacco. If you need help quitting, ask your health care provider.  Do not use street drugs.  Do not share needles.  Ask your health care provider for help if you need support or information about quitting drugs. Alcohol use  Do not drink alcohol if: ? Your health care provider tells you not to drink. ? You are pregnant, may be pregnant, or are planning to become pregnant.  If you drink alcohol: ? Limit how much you use to 0-1 drink a day. ? Limit intake if you are breastfeeding.  Be aware of how much alcohol is in your drink. In the [place] one drink equals one 12 oz bottle of beer (355 mL), one 5 oz glass of wine (148 mL), or one 1 oz glass of hard liquor (44 mL). General instructions  Schedule regular health, dental, and eye exams.  Stay current with your vaccines.  Tell your health care provider if: ? You often feel depressed. ? You have ever been abused or do not feel  safe at home. Summary  Adopting a healthy lifestyle and getting preventive care are important in promoting health and wellness.  Follow your health care provider's instructions about healthy diet, exercising, and getting tested or screened for diseases.  Follow your health care provider's instructions on monitoring your cholesterol and blood pressure. This information is not intended to replace advice given to you by your health care provider. Make sure you discuss any questions you have with your health care provider. Document Revised: 09/26/2018 Document Reviewed: 09/26/2018 Elsevier Patient Education  2020 [employer].

## 2020-05-04 NOTE — Assessment & Plan Note (Signed)
Describes menstrual migraine managed with PRN excedrin.

## 2021-04-16 ENCOUNTER — Other Ambulatory Visit: Payer: Self-pay

## 2021-04-16 ENCOUNTER — Ambulatory Visit (INDEPENDENT_AMBULATORY_CARE_PROVIDER_SITE_OTHER): Payer: BC Managed Care – PPO | Admitting: Obstetrics & Gynecology

## 2021-04-16 ENCOUNTER — Encounter: Payer: Self-pay | Admitting: Obstetrics & Gynecology

## 2021-04-16 VITALS — BP 104/68 | HR 82 | Resp 16 | Ht 62.25 in | Wt 141.0 lb

## 2021-04-16 DIAGNOSIS — Z3041 Encounter for surveillance of contraceptive pills: Secondary | ICD-10-CM

## 2021-04-16 DIAGNOSIS — Z01419 Encounter for gynecological examination (general) (routine) without abnormal findings: Secondary | ICD-10-CM

## 2021-04-16 MED ORDER — LEVONORGESTREL-ETHINYL ESTRAD 0.1-20 MG-MCG PO TABS: 1.0000 | ORAL_TABLET | Freq: Every day | ORAL | 4 refills | Status: DC

## 2021-04-16 NOTE — Progress Notes (Signed)
Jessica Acosta Sep 05, 1976 [id number]   History:    45 y.o.  G3P3L3 Married.  Promoter to Curriculum counselor for teachers.  Son 46 yo, daughter 54 yo, son 18 yo   RP:  Established patient presenting for annual gyn exam   HPI: Well on BCPs Orsythia 0.1/20.  No BTB.  No pelvic pain.  No pain with IC.  Urine/BMs normal.  Breasts normal.  BMI 25.58.  Good fitness and healthy nutrition.  Health labs with Fam MD.   Past medical history,surgical history, family history and social history were all reviewed and documented in the EPIC chart.  Gynecologic History Patient's last menstrual period was 03/31/2021 (exact date).  Obstetric History OB History  Gravida Para Term Preterm AB Living  3 3 3     3   SAB IAB Ectopic Multiple Live Births          3    # Outcome Date GA Lbr Len/2nd Weight Sex Delivery Anes PTL Lv  3 Term 08/31/11 [redacted]w[redacted]d 01:19 / 00:02 7 lb 3.7 oz (3.28 kg) M Vag-Spont Local  LIV     Birth Comments: no anamolies noted  2 Term           1 Term              ROS: A ROS was performed and pertinent positives and negatives are included in the history.  GENERAL: No fevers or chills. HEENT: No change in vision, no earache, sore throat or sinus congestion. NECK: No pain or stiffness. CARDIOVASCULAR: No chest pain or pressure. No palpitations. PULMONARY: No shortness of breath, cough or wheeze. GASTROINTESTINAL: No abdominal pain, nausea, vomiting or diarrhea, melena or bright red blood per rectum. GENITOURINARY: No urinary frequency, urgency, hesitancy or dysuria. MUSCULOSKELETAL: No joint or muscle pain, no back pain, no recent trauma. DERMATOLOGIC: No rash, no itching, no lesions. ENDOCRINE: No polyuria, polydipsia, no heat or cold intolerance. No recent change in weight. HEMATOLOGICAL: No anemia or easy bruising or bleeding. NEUROLOGIC: No headache, seizures, numbness, tingling or weakness. PSYCHIATRIC: No depression, no loss of interest in normal activity or change in sleep  pattern.     Exam:   BP 104/68   Pulse 82   Resp 16   Ht 5' 2.25" (1.581 m)   Wt 141 lb (64 kg)   LMP 03/31/2021 (Exact Date)   BMI 25.58 kg/m   Body mass index is 25.58 kg/m.  General appearance : Well developed well nourished female. No acute distress HEENT: Eyes: no retinal hemorrhage or exudates,  Neck supple, trachea midline, no carotid bruits, no thyroidmegaly Lungs: Clear to auscultation, no rhonchi or wheezes, or rib retractions  Heart: Regular rate and rhythm, no murmurs or gallops Breast:Examined in sitting and supine position were symmetrical in appearance, no palpable masses or tenderness,  no skin retraction, no nipple inversion, no nipple discharge, no skin discoloration, no axillary or supraclavicular lymphadenopathy Abdomen: no palpable masses or tenderness, no rebound or guarding Extremities: no edema or skin discoloration or tenderness  Pelvic: Vulva: Normal             Vagina: No gross lesions or discharge  Cervix: No gross lesions or discharge  Uterus  AV, normal size, shape and consistency, non-tender and mobile  Adnexa  Without masses or tenderness  Anus: Normal   Assessment/Plan:  45 y.o. female for annual exam   1. Well female exam with routine gynecological exam Normal gynecologic exam.  Pap test negative in 2020,  will repeat at 3 years next year.  Breast exam normal.  Will schedule screening mammogram now.  Good body mass index at 25.58.  Continue with fitness and healthy nutrition.  Health labs with family physician.  2. Encounter for surveillance of contraceptive pills Well on birth control pills.  Good compliance.  No contraindication.  Levonorgestrel-Estradiol 0.1-20 represcribed.  Other orders - levonorgestrel-ethinyl estradiol (SRONYX) 0.1-20 MG-MCG tablet; Take 1 tablet by mouth daily.   Genia Del MD, 10:51 AM 04/16/2021

## 2021-04-21 ENCOUNTER — Other Ambulatory Visit: Payer: Self-pay | Admitting: Family Medicine

## 2021-04-21 DIAGNOSIS — Z1231 Encounter for screening mammogram for malignant neoplasm of breast: Secondary | ICD-10-CM

## 2021-04-27 ENCOUNTER — Ambulatory Visit
Admission: RE | Admit: 2021-04-27 | Discharge: 2021-04-27 | Disposition: A | Discharge status: Home / Self Care | Payer: BC Managed Care – PPO | Source: Ambulatory Visit | Attending: Family Medicine | Admitting: Family Medicine

## 2021-04-27 ENCOUNTER — Other Ambulatory Visit: Payer: Self-pay

## 2021-04-27 DIAGNOSIS — Z1231 Encounter for screening mammogram for malignant neoplasm of breast: Secondary | ICD-10-CM

## 2021-06-08 ENCOUNTER — Ambulatory Visit (INDEPENDENT_AMBULATORY_CARE_PROVIDER_SITE_OTHER): Payer: BC Managed Care – PPO | Admitting: Family Medicine

## 2021-06-08 ENCOUNTER — Other Ambulatory Visit: Payer: Self-pay

## 2021-06-08 ENCOUNTER — Encounter: Payer: Self-pay | Admitting: Family Medicine

## 2021-06-08 VITALS — BP 128/86 | HR 78 | Temp 98.2°F | Ht 62.0 in | Wt 143.0 lb

## 2021-06-08 DIAGNOSIS — Z0000 Encounter for general adult medical examination without abnormal findings: Secondary | ICD-10-CM

## 2021-06-08 DIAGNOSIS — B070 Plantar wart: Secondary | ICD-10-CM

## 2021-06-08 DIAGNOSIS — E7800 Pure hypercholesterolemia, unspecified: Secondary | ICD-10-CM | POA: Diagnosis not present

## 2021-06-08 DIAGNOSIS — G43829 Menstrual migraine, not intractable, without status migrainosus: Secondary | ICD-10-CM | POA: Diagnosis not present

## 2021-06-08 NOTE — Assessment & Plan Note (Signed)
Reviewed home care measures which have been effective to date. She will let us know if desires further treatment in office.

## 2021-06-08 NOTE — Progress Notes (Signed)
Patient ID: Jessica Acosta, female    DOB: 1976/03/21, 45 y.o.   MRN: [id number]  This visit was conducted in person.  BP 128/86   Pulse 78   Temp 98.2 F (36.8 C) (Temporal)   Ht 5\' 2"  (1.575 m)   Wt 143 lb (64.9 kg)   SpO2 98%   BMI 26.16 kg/m    CC: CPE Subjective:   HPI: Jessica Acosta is a 45 y.o. female presenting on 06/08/2021 for Annual Exam   COVID 10/2020 - symptoms fully resolved  Plantar wart present to L sole for 2 months. Treating with compound W and tea tree oil with benefit.   Preventative: Well woman yearly with OBGYN Dr. 11/2020, last seen 04/2021. All normal paps, latest 03/2019. OCP through them.  Mammogram 04/2021 Bir[facility]1  LMP - 2 wks ago, regular, monthly on OCP.  Flu - yearly  COVID vaccine - Pfizer 12/2019 x2, booster x1  Tdap 08/2011  Seat belt use discussed Sunscreen use discussed. No changing moles on skin Ex-smoker - quit 2005  Alcohol - social on weekends Dentist q6 mo Eye exam yearly  Averaging 7-8 hours of sleep/day.   1 cup coffee in am Lives with husband Jessica Acosta and 3 children, 1 dog, 1 cat  Occupation: Jessica Acosta - curriculum facilitator  Edu: master's degree  Activity: plays tennis weekly, less this year, planning on rejoining Y  Diet: good water, fruits/vegetables daily      Relevant past medical, surgical, family and social history reviewed and updated as indicated. Interim medical history since our last visit reviewed. Allergies and medications reviewed and updated. Outpatient Medications Prior to Visit  Medication Sig Dispense Refill   levonorgestrel-ethinyl estradiol (SRONYX) 0.1-20 MG-MCG tablet Take 1 tablet by mouth daily. 84 tablet 4   Multiple Vitamin (MULTIVITAMIN) tablet Take 1 tablet by mouth daily.     Probiotic Product (PROBIOTIC PO) Take by mouth. As directed     No facility-administered medications prior to visit.     Per HPI unless specifically indicated in ROS section  below Review of Systems  Constitutional:  Negative for activity change, appetite change, chills, fatigue, fever and unexpected weight change.  HENT:  Negative for hearing loss.   Eyes:  Negative for visual disturbance.  Respiratory:  Negative for cough, chest tightness, shortness of breath and wheezing.   Cardiovascular:  Negative for chest pain, palpitations and leg swelling.  Gastrointestinal:  Negative for abdominal distention, abdominal pain, blood in stool, constipation, diarrhea, nausea and vomiting.  Genitourinary:  Negative for difficulty urinating and hematuria.  Musculoskeletal:  Negative for arthralgias, myalgias and neck pain.  Skin:  Negative for rash.  Neurological:  Positive for headaches (menstrual migraines managed with excedrin). Negative for dizziness, seizures and syncope.  Hematological:  Negative for adenopathy. Does not bruise/bleed easily.  Psychiatric/Behavioral:  Negative for dysphoric mood. The patient is not nervous/anxious.    Objective:  BP 128/86   Pulse 78   Temp 98.2 F (36.8 C) (Temporal)   Ht 5\' 2"  (1.575 m)   Wt 143 lb (64.9 kg)   SpO2 98%   BMI 26.16 kg/m   Wt Readings from Last 3 Encounters:  06/08/21 143 lb (64.9 kg)  04/16/21 141 lb (64 kg)  05/04/20 140 lb 6 oz (63.7 kg)      Physical Exam Vitals and nursing note reviewed.  Constitutional:      Appearance: Normal appearance. She is not ill-appearing.  HENT:  Head: Normocephalic and atraumatic.     Right Ear: Tympanic membrane, ear canal and external ear normal. There is no impacted cerumen.     Left Ear: Tympanic membrane, ear canal and external ear normal. There is no impacted cerumen.  Eyes:     General:        Right eye: No discharge.        Left eye: No discharge.     Extraocular Movements: Extraocular movements intact.     Conjunctiva/sclera: Conjunctivae normal.     Pupils: Pupils are equal, round, and reactive to light.  Neck:     Thyroid: No thyroid mass or  thyromegaly.  Cardiovascular:     Rate and Rhythm: Normal rate and regular rhythm.     Pulses: Normal pulses.     Heart sounds: Normal heart sounds. No murmur heard. Pulmonary:     Effort: Pulmonary effort is normal. No respiratory distress.     Breath sounds: Normal breath sounds. No wheezing, rhonchi or rales.  Abdominal:     General: Bowel sounds are normal. There is no distension.     Palpations: Abdomen is soft. There is no mass.     Tenderness: There is no abdominal tenderness. There is no guarding or rebound.     Hernia: No hernia is present.  Musculoskeletal:     Cervical back: Normal range of motion and neck supple. No rigidity.     Right lower leg: No edema.     Left lower leg: No edema.  Lymphadenopathy:     Cervical: No cervical adenopathy.  Skin:    General: Skin is warm and dry.     Findings: No rash.     Comments: Plantar wart to L sole with visualization of core   Neurological:     General: No focal deficit present.     Mental Status: She is alert. Mental status is at baseline.  Psychiatric:        Mood and Affect: Mood normal.        Behavior: Behavior normal.      Results for orders placed or performed in visit on 04/23/20  HM MAMMOGRAPHY  Result Value Ref Range   HM Mammogram 0-4 Bi-Rad 0-4 Bi-Rad, Self Reported Normal    Assessment & Plan:  This visit occurred during the SARS-CoV-2 public health emergency.  Safety protocols were in place, including screening questions prior to the visit, additional usage of staff PPE, and extensive cleaning of exam room while observing appropriate contact time as indicated for disinfecting solutions.   Problem List Items Addressed This Visit     Health care maintenance - Primary    Preventative protocols reviewed and updated unless pt declined. Discussed healthy diet and lifestyle.       Hyperlipidemia    Update FLP off meds. The 10-year ASCVD risk score Denman George DC Montez Hageman., et al., 2013) is: 0.7%   Values used to  calculate the score:     Age: 75 years     Sex: Female     Is Non-Hispanic African American: No     Diabetic: No     Tobacco smoker: No     Systolic Blood Pressure: 128 mmHg     Is BP treated: No     HDL Cholesterol: 66.3 mg/dL     Total Cholesterol: 235 mg/dL       Relevant Orders   Lipid panel   TSH   Basic metabolic panel   Menstrual migraine   Plantar  wart of left foot    Reviewed home care measures which have been effective to date. She will let us know if desires further treatment in office.        No orders of the defined types were placed in this encounter.  Orders Placed This Encounter  Procedures   Lipid panel   TSH   Basic metabolic panel     Patient instructions: You are doing well today  Return as needed or in 1 year for next physical.  Reincorporate regular exercise into routine  Follow up plan: Return in about 1 year (around 06/08/2022) for annual exam, prior fasting for blood work.  Eustaquio Boyden, MD

## 2021-06-08 NOTE — Patient Instructions (Addendum)
You are doing well today  Return as needed or in 1 year for next physical.  Reincorporate regular exercise into routine.   Health Maintenance, Female Adopting a healthy lifestyle and getting preventive care are important in promoting health and wellness. Ask your health care provider about: The right schedule for you to have regular tests and exams. Things you can do on your own to prevent diseases and keep yourself healthy. What should I know about diet, weight, and exercise? Eat a healthy diet  Eat a diet that includes plenty of vegetables, fruits, low-fat dairy products, and lean protein. Do not eat a lot of foods that are high in solid fats, added sugars, or sodium.  Maintain a healthy weight Body mass index (BMI) is used to identify weight problems. It estimates body fat based on height and weight. Your health care provider can help determineyour BMI and help you achieve or maintain a healthy weight. Get regular exercise Get regular exercise. This is one of the most important things you can do for your health. Most adults should: Exercise for at least 150 minutes each week. The exercise should increase your heart rate and make you sweat (moderate-intensity exercise). Do strengthening exercises at least twice a week. This is in addition to the moderate-intensity exercise. Spend less time sitting. Even light physical activity can be beneficial. Watch cholesterol and blood lipids Have your blood tested for lipids and cholesterol at 43 years of age, then havethis test every 5 years. Have your cholesterol levels checked more often if: Your lipid or cholesterol levels are high. You are older than 43 years of age. You are at high risk for heart disease. What should I know about cancer screening? Depending on your health history and family history, you may need to have cancer screening at various ages. This may include screening for: Breast cancer. Cervical cancer. Colorectal  cancer. Skin cancer. Lung cancer. What should I know about heart disease, diabetes, and high blood pressure? Blood pressure and heart disease High blood pressure causes heart disease and increases the risk of stroke. This is more likely to develop in people who have high blood pressure readings, are of African descent, or are overweight. Have your blood pressure checked: Every 3-5 years if you are 50-46 years of age. Every year if you are 101 years old or older. Diabetes Have regular diabetes screenings. This checks your fasting blood sugar level. Have the screening done: Once every three years after age 63 if you are at a normal weight and have a low risk for diabetes. More often and at a younger age if you are overweight or have a high risk for diabetes. What should I know about preventing infection? Hepatitis B If you have a higher risk for hepatitis B, you should be screened for this virus. Talk with your health care provider to find out if you are at risk forhepatitis B infection. Hepatitis C Testing is recommended for: Everyone born from [birth year] through 1963. Anyone with known risk factors for hepatitis C. Sexually transmitted infections (STIs) Get screened for STIs, including gonorrhea and chlamydia, if: You are sexually active and are younger than 43 years of age. You are older than 43 years of age and your health care provider tells you that you are at risk for this type of infection. Your sexual activity has changed since you were last screened, and you are at increased risk for chlamydia or gonorrhea. Ask your health care provider if you are at risk. Ask  your health care provider about whether you are at high risk for HIV. Your health care provider may recommend a prescription medicine to help prevent HIV infection. If you choose to take medicine to prevent HIV, you should first get tested for HIV. You should then be tested every 3 months for as long as you are taking the  medicine. Pregnancy If you are about to stop having your period (premenopausal) and you may become pregnant, seek counseling before you get pregnant. Take 400 to 800 micrograms (mcg) of folic acid every day if you become pregnant. Ask for birth control (contraception) if you want to prevent pregnancy. Osteoporosis and menopause Osteoporosis is a disease in which the bones lose minerals and strength with aging. This can result in bone fractures. If you are 92 years old or older, or if you are at risk for osteoporosis and fractures, ask your health care provider if you should: Be screened for bone loss. Take a calcium or vitamin D supplement to lower your risk of fractures. Be given hormone replacement therapy (HRT) to treat symptoms of menopause. Follow these instructions at home: Lifestyle Do not use any products that contain nicotine or tobacco, such as cigarettes, e-cigarettes, and chewing tobacco. If you need help quitting, ask your health care provider. Do not use street drugs. Do not share needles. Ask your health care provider for help if you need support or information about quitting drugs. Alcohol use Do not drink alcohol if: Your health care provider tells you not to drink. You are pregnant, may be pregnant, or are planning to become pregnant. If you drink alcohol: Limit how much you use to 0-1 drink a day. Limit intake if you are breastfeeding. Be aware of how much alcohol is in your drink. In the [place] one drink equals one 12 oz bottle of beer (355 mL), one 5 oz glass of wine (148 mL), or one 1 oz glass of hard liquor (44 mL). General instructions Schedule regular health, dental, and eye exams. Stay current with your vaccines. Tell your health care provider if: You often feel depressed. You have ever been abused or do not feel safe at home. Summary Adopting a healthy lifestyle and getting preventive care are important in promoting health and wellness. Follow your health  care provider's instructions about healthy diet, exercising, and getting tested or screened for diseases. Follow your health care provider's instructions on monitoring your cholesterol and blood pressure. This information is not intended to replace advice given to you by your health care provider. Make sure you discuss any questions you have with your healthcare provider. Document Revised: 09/26/2018 Document Reviewed: 09/26/2018 Elsevier Patient Education  2022 [employer].

## 2021-06-08 NOTE — Assessment & Plan Note (Signed)
Preventative protocols reviewed and updated unless pt declined. Discussed healthy diet and lifestyle.  

## 2021-06-08 NOTE — Assessment & Plan Note (Signed)
Update FLP off meds. The 10-year ASCVD risk score Denman George DC Montez Hageman., et al., 2013) is: 0.7%   Values used to calculate the score:     Age: 45 years     Sex: Female     Is Non-Hispanic African American: No     Diabetic: No     Tobacco smoker: No     Systolic Blood Pressure: 128 mmHg     Is BP treated: No     HDL Cholesterol: 66.3 mg/dL     Total Cholesterol: 235 mg/dL

## 2021-06-09 LAB — LIPID PANEL
Cholesterol: 245 mg/dL — ABNORMAL HIGH (ref 0–200)
HDL: 73.7 mg/dL (ref >39.00)
LDL Cholesterol: 151 mg/dL — ABNORMAL HIGH (ref 0–99)
NonHDL: 171.58
Total CHOL/HDL Ratio: 3
Triglycerides: 101 mg/dL (ref 0.0–149.0)
VLDL: 20.2 mg/dL (ref 0.0–40.0)

## 2021-06-09 LAB — TSH: TSH: 2.31 u[IU]/mL (ref 0.35–5.50)

## 2021-06-09 LAB — BASIC METABOLIC PANEL
BUN: 9 mg/dL (ref 6–23)
CO2: 24 mEq/L (ref 19–32)
Calcium: 9.6 mg/dL (ref 8.4–10.5)
Chloride: 100 mEq/L (ref 96–112)
Creatinine, Ser: 0.78 mg/dL (ref 0.40–1.20)
GFR: 92.98 mL/min (ref >60.00)
Glucose, Bld: 83 mg/dL (ref 70–99)
Potassium: 3.8 mEq/L (ref 3.5–5.1)
Sodium: 135 mEq/L (ref 135–145)

## 2021-12-02 IMAGING — MG DIGITAL SCREENING BILAT W/ TOMO W/ CAD
6 of 10 series · 6 of 30 positions shown · non-contrast
Comparison: None.

CLINICAL DATA: Screening.

EXAM:
DIGITAL SCREENING BILATERAL MAMMOGRAM WITH TOMO AND CAD

[L MLO synth-2D (1 of 2)]
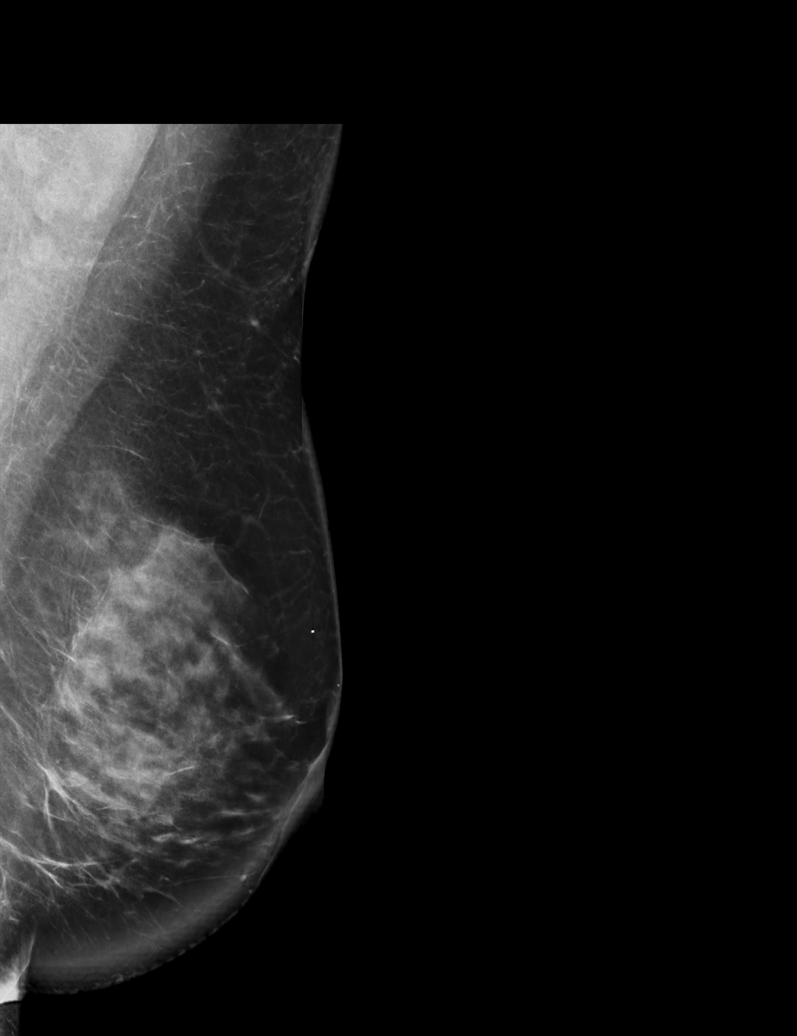

[L MLO synth-2D (2 of 2)]
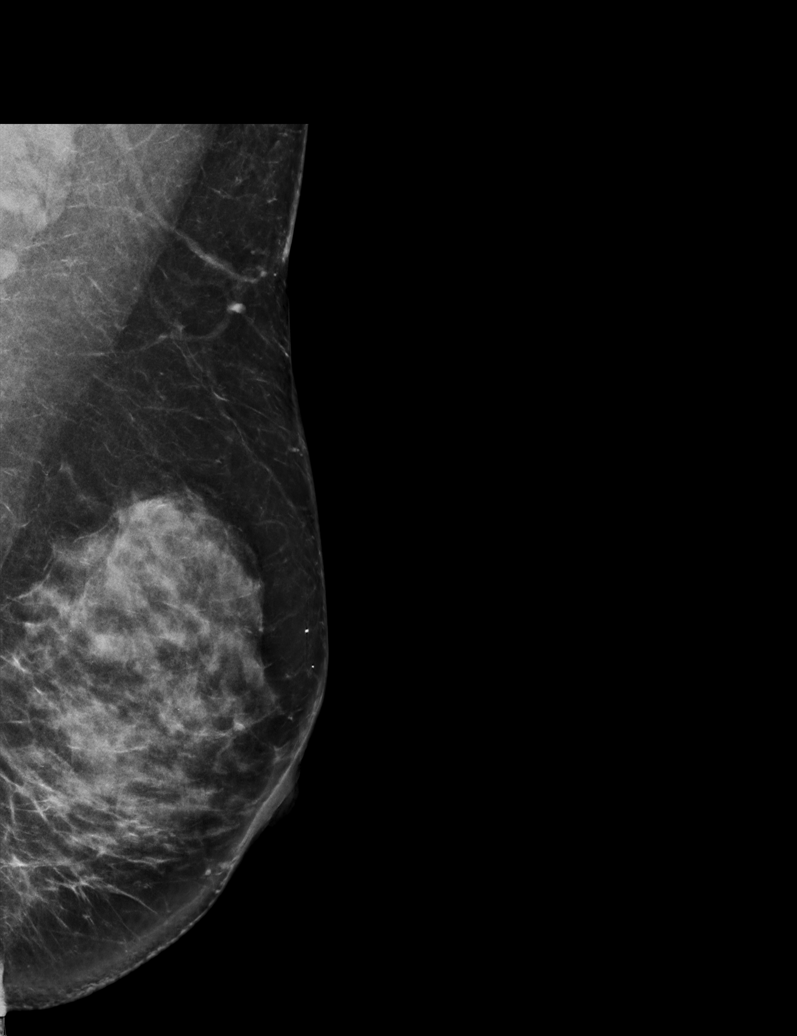

[R MLO synth-2D]
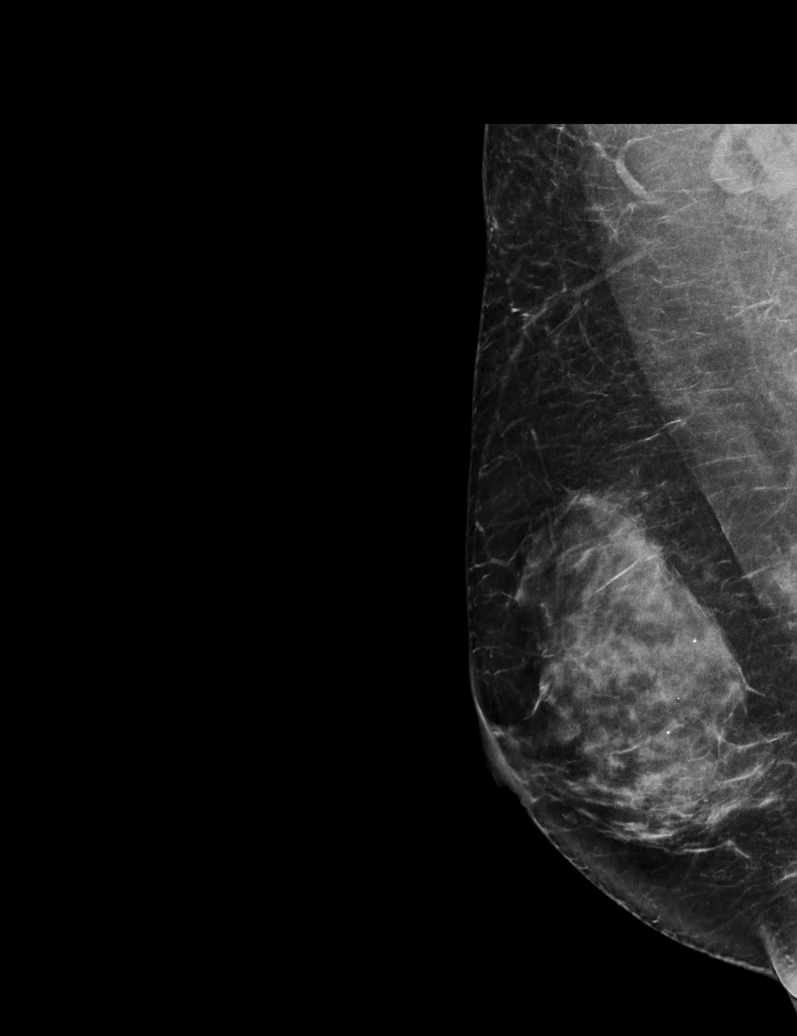

[R CC synth-2D]
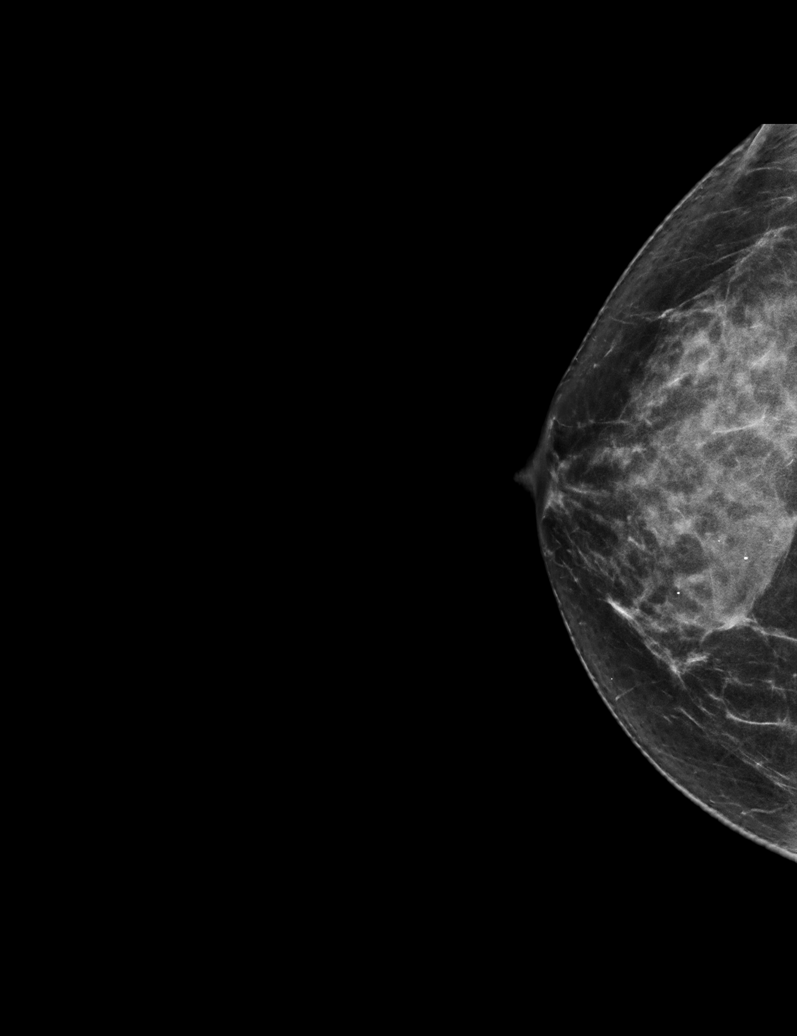

[L CC synth-2D]
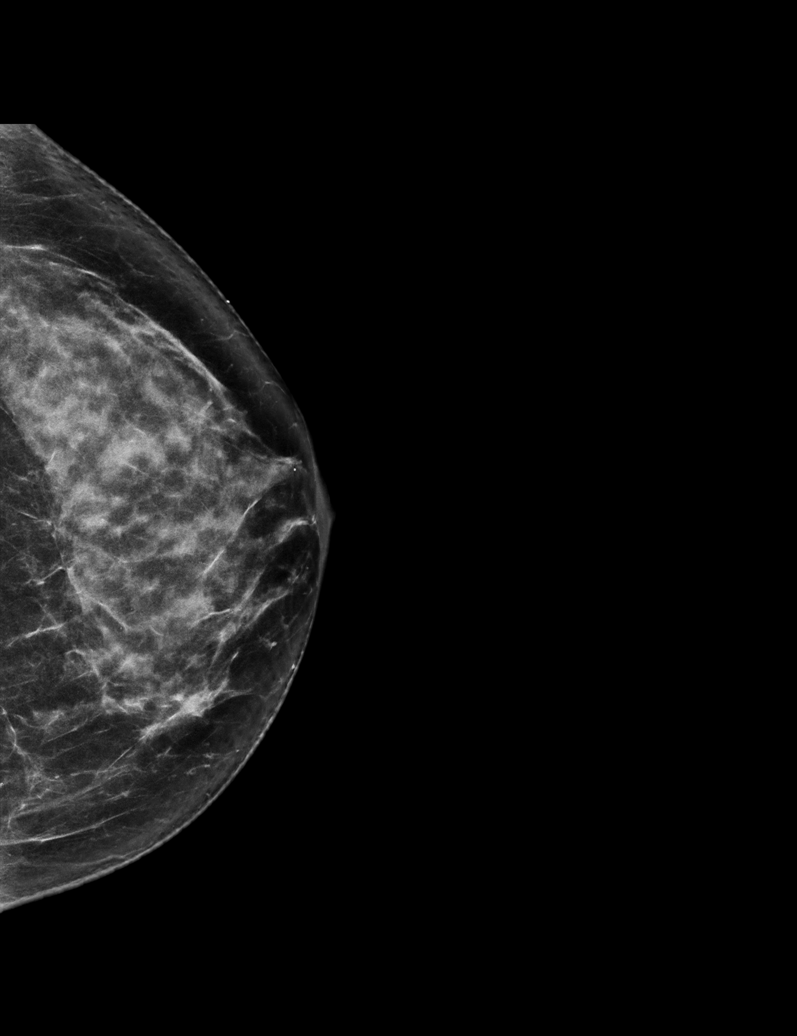

[L MLO tomo · tomo slice 49/98.0]
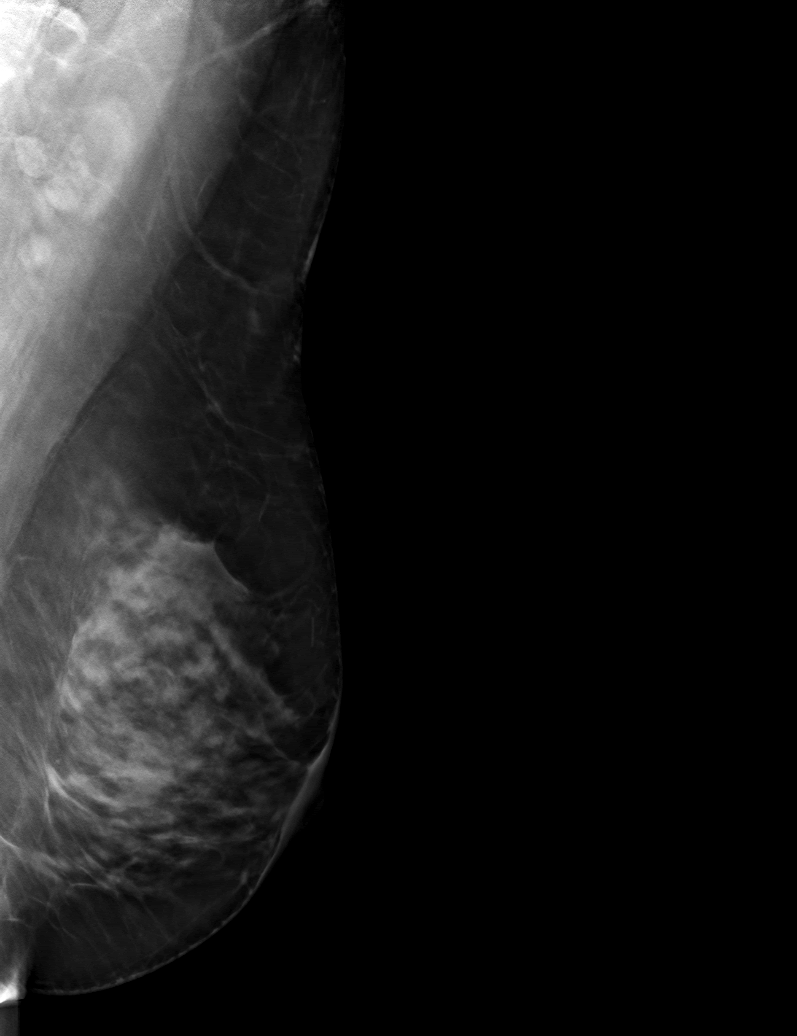

[6 of 30 positions shown; findings below may reference images not displayed]

ACR Breast Density Category d: The breast tissue is extremely dense,
which lowers the sensitivity of mammography.
FINDINGS: There are no findings suspicious for malignancy. Images were
processed with CAD.
IMPRESSION: No mammographic evidence of malignancy. A result letter of this
screening mammogram will be mailed directly to the patient.

RECOMMENDATION:
Screening mammogram in one year. (Code:[id number]-Y-POT)

The American Cancer Society recommends annual MRI and mammography in
patients with an estimated lifetime risk of developing breast cancer
greater than 20 - 25%, or who are known or suspected to be positive
for the breast cancer gene.

BI-RADS CATEGORY  1: Negative.

## 2022-04-20 ENCOUNTER — Ambulatory Visit: Payer: BC Managed Care – PPO | Admitting: Obstetrics & Gynecology

## 2022-04-22 ENCOUNTER — Encounter: Payer: Self-pay | Admitting: Obstetrics & Gynecology

## 2022-04-22 ENCOUNTER — Other Ambulatory Visit (HOSPITAL_COMMUNITY)
Admission: RE | Admit: 2022-04-22 | Discharge: 2022-04-22 | Disposition: A | Discharge status: Home / Self Care | Payer: BC Managed Care – PPO | Source: Ambulatory Visit | Attending: Obstetrics & Gynecology | Admitting: Obstetrics & Gynecology

## 2022-04-22 ENCOUNTER — Ambulatory Visit (INDEPENDENT_AMBULATORY_CARE_PROVIDER_SITE_OTHER): Payer: BC Managed Care – PPO | Admitting: Obstetrics & Gynecology

## 2022-04-22 VITALS — BP 110/80 | HR 70 | Resp 16 | Ht 62.5 in | Wt 144.0 lb

## 2022-04-22 DIAGNOSIS — Z01419 Encounter for gynecological examination (general) (routine) without abnormal findings: Secondary | ICD-10-CM

## 2022-04-22 DIAGNOSIS — Z3041 Encounter for surveillance of contraceptive pills: Secondary | ICD-10-CM | POA: Diagnosis not present

## 2022-04-22 MED ORDER — LEVONORGESTREL-ETHINYL ESTRAD 0.1-20 MG-MCG PO TABS: 1.0000 | ORAL_TABLET | Freq: Every day | ORAL | 4 refills | Status: DC

## 2022-04-22 NOTE — Progress Notes (Signed)
xxxxxxxxxxxxxxxxx   Jessica Acosta [birth year] [id number]   History:    46 y.o. G3P3L3 Married.  Promoter to Curriculum counselor for teachers.  Son 55 yo, daughter 72 yo, son 47 yo   RP:  Established patient presenting for annual gyn exam   HPI: Well on BCPs Orsythia 0.1/20.  No BTB.  No pelvic pain.  No pain with IC. Pap Neg 03/2019.  No h/o abnormal Pap.  Pap reflex today.  Urine/BMs normal.  Breasts normal.  Mammo Neg 04/2021. BMI 25.92.  Good fitness and healthy nutrition.  Health labs with Fam MD.   Past medical history,surgical history, family history and social history were all reviewed and documented in the EPIC chart.  Gynecologic History Patient's last menstrual period was 03/30/2022 (exact date).  Obstetric History OB History  Gravida Para Term Preterm AB Living  3 3 3     3   SAB IAB Ectopic Multiple Live Births          3    # Outcome Date GA Lbr Len/2nd Weight Sex Delivery Anes PTL Lv  3 Term 08/31/11 [redacted]w[redacted]d 01:19 / 00:02 7 lb 3.7 oz (3.28 kg) M Vag-Spont Local  LIV     Birth Comments: no anamolies noted  2 Term           1 Term              ROS: A ROS was performed and pertinent positives and negatives are included in the history. GENERAL: No fevers or chills. HEENT: No change in vision, no earache, sore throat or sinus congestion. NECK: No pain or stiffness. CARDIOVASCULAR: No chest pain or pressure. No palpitations. PULMONARY: No shortness of breath, cough or wheeze. GASTROINTESTINAL: No abdominal pain, nausea, vomiting or diarrhea, melena or bright red blood per rectum. GENITOURINARY: No urinary frequency, urgency, hesitancy or dysuria. MUSCULOSKELETAL: No joint or muscle pain, no back pain, no recent trauma. DERMATOLOGIC: No rash, no itching, no lesions. ENDOCRINE: No polyuria, polydipsia, no heat or cold intolerance. No recent change in weight. HEMATOLOGICAL: No anemia or easy bruising or bleeding. NEUROLOGIC: No headache, seizures, numbness, tingling or weakness.  PSYCHIATRIC: No depression, no loss of interest in normal activity or change in sleep pattern.     Exam:   BP 110/80   Pulse 70   Resp 16   Ht 5' 2.5" (1.588 m)   Wt 144 lb (65.3 kg)   LMP 03/30/2022 (Exact Date)   BMI 25.92 kg/m   Body mass index is 25.92 kg/m.  General appearance : Well developed well nourished female. No acute distress HEENT: Eyes: no retinal hemorrhage or exudates,  Neck supple, trachea midline, no carotid bruits, no thyroidmegaly Lungs: Clear to auscultation, no rhonchi or wheezes, or rib retractions  Heart: Regular rate and rhythm, no murmurs or gallops Breast:Examined in sitting and supine position were symmetrical in appearance, no palpable masses or tenderness,  no skin retraction, no nipple inversion, no nipple discharge, no skin discoloration, no axillary or supraclavicular lymphadenopathy Abdomen: no palpable masses or tenderness, no rebound or guarding Extremities: no edema or skin discoloration or tenderness  Pelvic: Vulva: Normal             Vagina: No gross lesions or discharge  Cervix: No gross lesions or discharge.  Pap reflex done.  Uterus  AV, normal size, shape and consistency, non-tender and mobile  Adnexa  Without masses or tenderness  Anus: Normal   Assessment/Plan:  46 y.o. female for annual exam  1. Encounter for routine gynecological examination with Papanicolaou smear of cervix Well on BCPs Orsythia 0.1/20.  No BTB.  No pelvic pain.  No pain with IC. Pap Neg 03/2019.  No h/o abnormal Pap.  Pap reflex today.  Urine/BMs normal.  Breasts normal.  Mammo Neg 04/2021. BMI 25.92.  Good fitness and healthy nutrition.  Health labs with Fam MD. - Cytology - PAP( [facility])  2. Encounter for surveillance of contraceptive pills Well on BCPs Orsythia 0.1/20.  No BTB.  No pelvic pain.  No pain with IC.  No CI to BCPs.  Prescription sent to pharmacy.  Other orders - levonorgestrel-ethinyl estradiol (SRONYX) 0.1-20 MG-MCG tablet; Take 1 tablet  by mouth daily.   Genia Del MD, 8:16 AM 04/22/2022

## 2022-04-25 LAB — CYTOLOGY - PAP: Diagnosis: NEGATIVE

## 2022-12-19 ENCOUNTER — Other Ambulatory Visit: Payer: Self-pay

## 2022-12-19 ENCOUNTER — Emergency Department
Admission: EM | Admit: 2022-12-19 | Discharge: 2022-12-19 | Disposition: A | Discharge status: Home / Self Care | Payer: BC Managed Care – PPO | Attending: Emergency Medicine | Admitting: Emergency Medicine

## 2022-12-19 ENCOUNTER — Encounter: Payer: Self-pay | Admitting: Emergency Medicine

## 2022-12-19 ENCOUNTER — Emergency Department: Payer: BC Managed Care – PPO

## 2022-12-19 DIAGNOSIS — R0789 Other chest pain: Secondary | ICD-10-CM | POA: Diagnosis not present

## 2022-12-19 LAB — BASIC METABOLIC PANEL
Anion gap: 8 (ref 5–15)
BUN: 10 mg/dL (ref 6–20)
CO2: 25 mmol/L (ref 22–32)
Calcium: 9.1 mg/dL (ref 8.9–10.3)
Chloride: 102 mmol/L (ref 98–111)
Creatinine, Ser: 0.72 mg/dL (ref 0.44–1.00)
GFR, Estimated: >60 mL/min (ref >60)
Glucose, Bld: 102 mg/dL — ABNORMAL HIGH (ref 70–99)
Potassium: 3.8 mmol/L (ref 3.5–5.1)
Sodium: 135 mmol/L (ref 135–145)

## 2022-12-19 LAB — CBC
HCT: 38.6 % (ref 36.0–46.0)
Hemoglobin: 13.1 g/dL (ref 12.0–15.0)
MCH: 29.1 pg (ref 26.0–34.0)
MCHC: 33.9 g/dL (ref 30.0–36.0)
MCV: 85.8 fL (ref 80.0–100.0)
Platelets: 351 10*3/uL (ref 150–400)
RBC: 4.5 MIL/uL (ref 3.87–5.11)
RDW: 11.9 % (ref 11.5–15.5)
WBC: 12.4 10*3/uL — ABNORMAL HIGH (ref 4.0–10.5)
nRBC: 0 % (ref 0.0–0.2)

## 2022-12-19 LAB — TROPONIN I (HIGH SENSITIVITY): Troponin I (High Sensitivity): 2 ng/L (ref <18)

## 2022-12-19 NOTE — ED Triage Notes (Signed)
Patient to ED via [facility] from work for CP that started at 0530 this morning. States that pain is sharp and tight on the left side. BP initially 200/110. EMA gave 324 aspirin and 0.4 of Nitro. No pain after the Nitro.  176/97 96 HR 97 % 108 cbg

## 2022-12-19 NOTE — ED Provider Notes (Signed)
[facility] Provider Note    Event Date/Time   First MD Initiated Contact with Patient 12/19/22 1226     (approximate)   History   Chest Pain   HPI  Jessica Acosta is a 47 y.o. female with no significant past medical history who presents with complaints of chest discomfort.  Patient reports since around 5 AM this morning she has had a sharp, poking pain in her left chest that has been intermittent.  She denies shortness of breath.  No pleurisy.  No rash.  No injury to the area.  No fevers chills or cough.  No history of heart disease.  She does not smoke, no drugs.  No calf pain or swelling     Physical Exam   Triage Vital Signs: ED Triage Vitals  Enc Vitals Group     BP 12/19/22 1146 (!) 176/107     Pulse Rate 12/19/22 1146 (!) 101     Resp 12/19/22 1146 18     Temp 12/19/22 1146 98.1 F (36.7 C)     Temp Source 12/19/22 1146 Oral     SpO2 12/19/22 1146 95 %     Weight --      Height --      Head Circumference --      Peak Flow --      Pain Score 12/19/22 1144 0     Pain Loc --      Pain Edu? --      Excl. in [place]? --     Most recent vital signs: Vitals:   12/19/22 1146  BP: (!) 176/107  Pulse: (!) 101  Resp: 18  Temp: 98.1 F (36.7 C)  SpO2: 95%     General: Awake, no distress.  CV:  Good peripheral perfusion.  Regular rate and rhythm Resp:  Normal effort.  Clear to auscultation bilaterally Abd:  No distention.  Other:  No calf pain or swelling   ED Results / Procedures / Treatments   Labs (all labs ordered are listed, but only abnormal results are displayed) Labs Reviewed  BASIC METABOLIC PANEL - Abnormal; Notable for the following components:      Result Value   Glucose, Bld 102 (*)    All other components within normal limits  CBC - Abnormal; Notable for the following components:   WBC 12.4 (*)    All other components within normal limits  POC URINE PREG, ED  TROPONIN I (HIGH SENSITIVITY)  TROPONIN I (HIGH  SENSITIVITY)     EKG  ED ECG REPORT I, Lavonia Drafts, the attending physician, personally viewed and interpreted this ECG.  Date: 12/19/2022  Rhythm: normal sinus rhythm QRS Axis: normal Intervals: normal ST/T Wave abnormalities: normal Narrative Interpretation: no evidence of acute ischemia    RADIOLOGY Chest x-ray viewed interpreted by me, no acute abnormality    PROCEDURES:  Critical Care performed:   Procedures   MEDICATIONS ORDERED IN ED: Medications - No data to display   IMPRESSION / MDM / [place] / ED COURSE  I reviewed the triage vital signs and the nursing notes. Patient's presentation is most consistent with acute presentation with potential threat to life or bodily function.  Patient presents with chest pain as detailed above, differential includes ACS, angina, PE, musculoskeletal pain, pneumonia  Overall patient is quite well-appearing and in no acute distress, EKG is reassuring, high sensitive troponin is normal.  Doubt ACS or angina.  Via shared decision making discussed with  patient at length regarding CT angiography for PE however she has opted to not obtain scan at this time.     Lab work overall quite reassuring, chest x-ray without abnormality  She is asymptomatic at this time, no indication for admission.  She will follow-up with cardiologist and will return to the ED if any change in her symptoms.        FINAL CLINICAL IMPRESSION(S) / ED DIAGNOSES   Final diagnoses:  Atypical chest pain     Rx / DC Orders   ED Discharge Orders          Ordered    Ambulatory referral to Cardiology       Comments: If you have not heard from the Cardiology office within the next 72 hours please call [phone].   12/19/22 1312             Note:  This document was prepared using Dragon voice recognition software and may include unintentional dictation errors.   Lavonia Drafts, MD 12/19/22 [phone]

## 2022-12-20 ENCOUNTER — Telehealth: Payer: Self-pay

## 2022-12-20 NOTE — Transitions of Care (Post Inpatient/ED Visit) (Signed)
   12/20/2022  Name: Jessica Acosta MRN: [id number] DOB: 02-25-1976  Today's TOC FU Call Status: Today's TOC FU Call Status:: Successful TOC FU Call Competed TOC FU Call Complete Date: 12/20/22  Transition Care Management Follow-up Telephone Call Date of Discharge: 12/19/22 Discharge Facility: [facility]) Type of Discharge: Emergency Department Reason for ED Visit: Cardiac Conditions Cardiac Conditions Diagnosis:  (Atypical chest pain) How have you been since you were released from the hospital?: Better Any questions or concerns?: No  Items Reviewed: Did you receive and understand the discharge instructions provided?: Yes Medications obtained and verified?: Yes (Medications Reviewed) Any new allergies since your discharge?: No Dietary orders reviewed?: Yes Do you have support at home?: Yes  Home Care and Equipment/Supplies: [place] Ordered?: NA Any new equipment or medical supplies ordered?: NA  Functional Questionnaire: Do you need assistance with bathing/showering or dressing?: No Do you need assistance with meal preparation?: No Do you need assistance with eating?: No Do you have difficulty maintaining continence: No Do you need assistance with getting out of bed/getting out of a chair/moving?: No Do you have difficulty managing or taking your medications?: No  Folllow up appointments reviewed: PCP Follow-up appointment confirmed?: Yes Date of PCP follow-up appointment?: 12/26/22 Follow-up Provider: [facility] Follow-up appointment confirmed?: No Do you need transportation to your follow-up appointment?: No Do you understand care options if your condition(s) worsen?: Yes-patient verbalized understanding    [place] LPN [place] [phone]

## 2022-12-26 ENCOUNTER — Encounter: Payer: Self-pay | Admitting: Family Medicine

## 2022-12-26 ENCOUNTER — Ambulatory Visit: Payer: BC Managed Care – PPO | Admitting: Family Medicine

## 2022-12-26 VITALS — BP 136/84 | HR 75 | Temp 97.3°F | Ht 62.5 in | Wt 147.2 lb

## 2022-12-26 DIAGNOSIS — F419 Anxiety disorder, unspecified: Secondary | ICD-10-CM | POA: Diagnosis not present

## 2022-12-26 DIAGNOSIS — R0789 Other chest pain: Secondary | ICD-10-CM | POA: Diagnosis not present

## 2022-12-26 DIAGNOSIS — E7800 Pure hypercholesterolemia, unspecified: Secondary | ICD-10-CM

## 2022-12-26 DIAGNOSIS — Z8249 Family history of ischemic heart disease and other diseases of the circulatory system: Secondary | ICD-10-CM | POA: Diagnosis not present

## 2022-12-26 NOTE — Patient Instructions (Addendum)
Continue aspirin for now. Ok to continue hydroxyzine as needed.  Labs today. We will be in touch with results and plan.  Keep appointment with cardiology.  Let us know if any worsening symptoms.

## 2022-12-26 NOTE — Assessment & Plan Note (Signed)
ER records reviewed with patient.  Describes chest discomfort associated with elevated blood pressure readings, not consistent with MSK cause or GERD. She does have CAD in family.  Reassuringly no exertional chest discomfort, including when participating in HIIT last month.  Continue aspirin '81mg'$  daily and keep cardiology appt for further evaluation.  In fmhx clotting disorder and current OCP use, will eval for PE with D-dimer - discussed if elevated will proceed with CTA chest.

## 2022-12-26 NOTE — Progress Notes (Signed)
Patient ID: GREG CAPPA, female    DOB: Aug 04, 1976, 47 y.o.   MRN: [id number]  This visit was conducted in person.  BP 136/84   Pulse 75   Temp (!) 97.3 F (36.3 C) (Temporal)   Ht 5' 2.5" (1.588 m)   Wt 147 lb 4 oz (66.8 kg)   LMP 12/05/2022 (Within Days)   SpO2 99%   BMI 26.50 kg/m    CC: ER f/u visit  Subjective:   HPI: Jessica Acosta is a 47 y.o. female presenting on 12/26/2022 for Hospitalization Follow-up (Seen on 12/19/22 at [facility] ED, dx atypical chest pain. ) and Labs Only   Not fasting - ate lunch 2 hrs ago.   Recent ER evaluation at [facility] last Monday for chest pain described as intermittent sharp poking discomfort. She was also found to have very high blood pressures. Workup included reassuring EKG, normal Troponin I and CXR. WBC mildly elevated at 12.4.  Pain described as left chest "tight squeeze" - lasted 5 hours Monday morning when she woke up. Home BP at that time 160s/100s.   She does note allergies have been flaring. Had taken some dimetapp and claritin.   She's been going to gym 4 nights/week for the past month, doing HIIT. Last time she worked out was Monday 12/12/2022. She has been doing some light walks at home and tolerated fine. Never chest discomfort with exertion.   No unilateral leg swelling. No recent travel.  She is on OCP (Soronix).  Younger sister recently had blood clots x3 - renal artery, ovarian artery, and one other location. Maternal grandmother also had h/o DVT.   Ongoing intermittent chest discomfort since last Monday. She started aspirin '81mg'$  daily. Since has been monitoring BP more closely - 100-130s/70-80s, HR 60-70s. She's been taking hydroxyzine 12.'5mg'$  nightly which may have helped. She notes some discomfort to left thoracic back area. She also notes faster heart rate than normal to 100.   No dyspnea, nausea/vomiting, dizziness, HA.  No heartburn/indigestion or reflux type symptoms.  No recent increases in stress.  She was  referred to cardiology and has appt with Dr Harl Bowie 01/05/2023.      Relevant past medical, surgical, family and social history reviewed and updated as indicated. Interim medical history since our last visit reviewed. Allergies and medications reviewed and updated. Outpatient Medications Prior to Visit  Medication Sig Dispense Refill   aspirin EC 81 MG tablet Take 81 mg by mouth daily. Swallow whole.     hydrOXYzine (ATARAX) 25 MG tablet Take 0.5-1 tablets (12.5-25 mg total) by mouth 2 (two) times daily as needed for anxiety (sedation precautions). 30 tablet 0   levonorgestrel-ethinyl estradiol (SRONYX) 0.1-20 MG-MCG tablet Take 1 tablet by mouth daily. 84 tablet 4   Multiple Vitamin (MULTIVITAMIN) tablet Take 1 tablet by mouth daily.     Probiotic Product (PROBIOTIC PO) Take by mouth. As directed     No facility-administered medications prior to visit.     Per HPI unless specifically indicated in ROS section below Review of Systems  Objective:  BP 136/84   Pulse 75   Temp (!) 97.3 F (36.3 C) (Temporal)   Ht 5' 2.5" (1.588 m)   Wt 147 lb 4 oz (66.8 kg)   LMP 12/05/2022 (Within Days)   SpO2 99%   BMI 26.50 kg/m   Wt Readings from Last 3 Encounters:  12/26/22 147 lb 4 oz (66.8 kg)  04/22/22 144 lb (65.3 kg)  06/08/21 143 lb (  64.9 kg)      Physical Exam Vitals and nursing note reviewed.  Constitutional:      Appearance: Normal appearance. She is not ill-appearing.  HENT:     Head: Normocephalic and atraumatic.     Mouth/Throat:     Mouth: Mucous membranes are moist.     Pharynx: Oropharynx is clear. No oropharyngeal exudate or posterior oropharyngeal erythema.  Eyes:     Extraocular Movements: Extraocular movements intact.     Conjunctiva/sclera: Conjunctivae normal.     Pupils: Pupils are equal, round, and reactive to light.  Neck:     Thyroid: No thyroid mass or thyromegaly.  Cardiovascular:     Rate and Rhythm: Normal rate and regular rhythm.     Pulses: Normal  pulses.     Heart sounds: Normal heart sounds. No murmur heard. Pulmonary:     Effort: Pulmonary effort is normal. No respiratory distress.     Breath sounds: Normal breath sounds. No wheezing, rhonchi or rales.     Comments: No sternal or costochondral discomfort to palpation Chest:     Chest wall: No tenderness.  Musculoskeletal:     Right lower leg: No edema.     Left lower leg: No edema.  Skin:    General: Skin is warm and dry.     Findings: No erythema or rash.  Neurological:     Mental Status: She is alert.  Psychiatric:        Mood and Affect: Mood normal.        Behavior: Behavior normal.       Results for orders placed or performed during the hospital encounter of [id number]  Basic metabolic panel  Result Value Ref Range   Sodium 135 135 - 145 mmol/L   Potassium 3.8 3.5 - 5.1 mmol/L   Chloride 102 98 - 111 mmol/L   CO2 25 22 - 32 mmol/L   Glucose, Bld 102 (H) 70 - 99 mg/dL   BUN 10 6 - 20 mg/dL   Creatinine, Ser 0.72 0.44 - 1.00 mg/dL   Calcium 9.1 8.9 - 10.3 mg/dL   GFR, Estimated >60 >60 mL/min   Anion gap 8 5 - 15  CBC  Result Value Ref Range   WBC 12.4 (H) 4.0 - 10.5 K/uL   RBC 4.50 3.87 - 5.11 MIL/uL   Hemoglobin 13.1 12.0 - 15.0 g/dL   HCT 38.6 36.0 - 46.0 %   MCV 85.8 80.0 - 100.0 fL   MCH 29.1 26.0 - 34.0 pg   MCHC 33.9 30.0 - 36.0 g/dL   RDW 11.9 11.5 - 15.5 %   Platelets 351 150 - 400 K/uL   nRBC 0.0 0.0 - 0.2 %  Troponin I (High Sensitivity)  Result Value Ref Range   Troponin I (High Sensitivity) 2 <18 ng/L   Lab Results  Component Value Date   CHOL 245 (H) 06/08/2021   HDL 73.70 06/08/2021   LDLCALC 151 (H) 06/08/2021   LDLDIRECT 148.1 11/28/2013   TRIG 101.0 06/08/2021   CHOLHDL 3 06/08/2021    Lab Results  Component Value Date   TSH 2.31 06/08/2021    Assessment & Plan:   Problem List Items Addressed This Visit     Chest discomfort - Primary    ER records reviewed with patient.  Describes chest discomfort associated with  elevated blood pressure readings, not consistent with MSK cause or GERD. She does have CAD in family.  Reassuringly no exertional chest discomfort, including when participating  in HIIT last month.  Continue aspirin '81mg'$  daily and keep cardiology appt for further evaluation.  In fmhx clotting disorder and current OCP use, will eval for PE with D-dimer - discussed if elevated will proceed with CTA chest.       Relevant Orders   D-dimer, quantitative   TSH   Hyperlipidemia    Update dLDL (nonfasting) and Lp(a) in fmhx CAD.  The 10-year ASCVD risk score (Arnett DK, et al., 2019) is: 0.8%   Values used to calculate the score:     Age: 51 years     Sex: Female     Is Non-Hispanic African American: No     Diabetic: No     Tobacco smoker: No     Systolic Blood Pressure: XX123456 mmHg     Is BP treated: No     HDL Cholesterol: 73.7 mg/dL     Total Cholesterol: 245 mg/dL       Relevant Orders   LDL Cholesterol, Direct   Lipoprotein A (LPA)   Other Visit Diagnoses     Anxiety       Relevant Medications   hydrOXYzine (ATARAX) 25 MG tablet   Family history of premature CAD       Relevant Orders   Lipoprotein A (LPA)        No orders of the defined types were placed in this encounter.   Orders Placed This Encounter  Procedures   D-dimer, quantitative   LDL Cholesterol, Direct   Lipoprotein A (LPA)   TSH    Patient Instructions  Continue aspirin for now. Ok to continue hydroxyzine as needed.  Labs today. We will be in touch with results and plan.  Keep appointment with cardiology.  Let us know if any worsening symptoms.   Follow up plan: Return if symptoms worsen or fail to improve.  [name], MD

## 2022-12-26 NOTE — Assessment & Plan Note (Addendum)
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  The 10-year ASCVD risk score (Arnett DK, et al., 2019) is: 0.8%   Values used to calculate the score:     Age: 47 years     Sex: Female     Is Non-Hispanic African American: No     Diabetic: No     Tobacco smoker: No     Systolic Blood Pressure: XX123456 mmHg     Is BP treated: No     HDL Cholesterol: 73.7 mg/dL     Total Cholesterol: 245 mg/dL

## 2022-12-27 LAB — LDL CHOLESTEROL, DIRECT: Direct LDL: 183 mg/dL

## 2022-12-27 LAB — TSH: TSH: 2.27 u[IU]/mL (ref 0.35–5.50)

## 2022-12-29 LAB — D-DIMER, QUANTITATIVE: D-Dimer, Quant: 0.26 mcg/mL FEU (ref <0.50)

## 2022-12-29 LAB — LIPOPROTEIN A (LPA): Lipoprotein (a): 132 nmol/L — ABNORMAL HIGH (ref <75)

## 2022-12-31 ENCOUNTER — Other Ambulatory Visit: Payer: Self-pay | Admitting: Family Medicine

## 2022-12-31 MED ORDER — ATORVASTATIN CALCIUM 40 MG PO TABS: 40.0000 mg | ORAL_TABLET | Freq: Every day | ORAL | 3 refills | Status: DC

## 2023-01-04 NOTE — Progress Notes (Unsigned)
Cardiology Office Note:    Date:  01/05/2023   ID:  Jessica Acosta, DOB 1976/06/23, MRN [id number]  PCP:  [name], [place] Providers Cardiologist:  Janina Mayo, MD     Referring MD: Lavonia Drafts, MD   No chief complaint on file. Atypical CP  History of Present Illness:    Jessica Acosta is a 47 y.o. female with a hx of scoliosis, referral for chest discomfort from [facility] for chest discomfort. At 5 AM on 3/4, she noted sharp/poking CP. She notes chest discomfort at work throughout the day. Her blood pressures was high. Called the fire department and noted BP was 200/110 mmHg.In the ED, her blood pressure was significantly high 176/107 and HR 101.EKG showed no ischemic changes. Troponin was negative. Cxray was normal. 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 Recommended to take asa 81 mg daily.  She does have hx of anxiety and has had similar episodes. She gets palpitations. Notes can have discomfort with chest pressure after exercising on the elliptical. Blood pressure well controlled today. She hopes to differentiate between stress and CAD. Family hx significant for MGF had an MI ~ age 99. Smoking hx -former smoker quit 2005  Past Medical History:  Diagnosis Date   History of chicken pox    Scoliosis     Past Surgical History:  Procedure Laterality Date   mole removed from ear  1987   wisdom teeth removal      Current Medications: Current Meds  Medication Sig   aspirin EC 81 MG tablet Take 81 mg by mouth daily. Swallow whole.   atorvastatin (LIPITOR) 40 MG tablet Take 1 tablet (40 mg total) by mouth daily.   hydrOXYzine (ATARAX) 25 MG tablet Take 0.5-1 tablets (12.5-25 mg total) by mouth 2 (two) times daily as needed for anxiety (sedation precautions).   levonorgestrel-ethinyl estradiol (SRONYX) 0.1-20 MG-MCG tablet Take 1 tablet by mouth daily.   metoprolol tartrate (LOPRESSOR) 50 MG tablet Take 2 hours before CT scan   Multiple Vitamin  (MULTIVITAMIN) tablet Take 1 tablet by mouth daily.   Probiotic Product (PROBIOTIC PO) Take by mouth. As directed     Allergies:   Patient has no known allergies.   Social History   Socioeconomic History   Marital status: Married    Spouse name: Not on file   Number of children: Not on file   Years of education: Not on file   Highest education level: Not on file  Occupational History   Not on file  Tobacco Use   Smoking status: Former    Types: Cigarettes    Quit date: 11/17/2003    Years since quitting: 19.1   Smokeless tobacco: Never  Vaping Use   Vaping Use: Never used  Substance and Sexual Activity   Alcohol use: Yes    Comment: 2 glasses wine occasionally   Drug use: No   Sexual activity: Yes    Partners: Male    Birth control/protection: OCP    Comment: 1st intercourse- 21, partners- 64  Other Topics Concern   Not on file  Social History Narrative   Lives with husband Jessica Acosta and 3 children, 1 dog, 1 cat   Occupation: elem school teacher   Edu: master's degree   Activity: no regular exercise   Diet: good water, fruits/vegetables daily   Social Determinants of Health   Financial Resource Strain: Not on file  Food Insecurity: Not on file  Transportation Needs: Not on  file  Physical Activity: Not on file  Stress: Not on file  Social Connections: Not on file     Family History: The patient's family history includes Arthritis in her maternal grandmother; Breast cancer in her maternal aunt, maternal grandmother, and paternal grandmother; CAD in her paternal grandmother; CAD (age of onset: 91) in her maternal grandfather; CAD (age of onset: 44) in her father; Cancer in her paternal grandfather and paternal grandmother; Clotting disorder in her sister; Congestive Heart Failure in her paternal grandfather; Deep vein thrombosis in her maternal grandmother; Diabetes in her father, maternal grandfather, mother, and paternal grandmother; Heart disease in her father  and paternal grandmother; Hyperlipidemia in her father; Hypertension in her father and sister; Mitral valve prolapse (age of onset: 36) in her father; Stroke in her paternal grandmother and sister.  ROS:   Please see the history of present illness.     All other systems reviewed and are negative.  EKGs/Labs/Other Studies Reviewed:    The following studies were reviewed today:   EKG:  EKG is  ordered today.  The ekg ordered today demonstrates   EKG 01/05/2023- NSR  Recent Labs: 12/19/2022: BUN 10; Creatinine, Ser 0.72; Hemoglobin 13.1; Platelets 351; Potassium 3.8; Sodium 135 12/26/2022: TSH 2.27  Recent Lipid Panel    Component Value Date/Time   CHOL 245 (H) 06/08/2021 1624   TRIG 101.0 06/08/2021 1624   HDL 73.70 06/08/2021 1624   CHOLHDL 3 06/08/2021 1624   VLDL 20.2 06/08/2021 1624   LDLCALC 151 (H) 06/08/2021 1624   LDLDIRECT 183.0 12/26/2022 1459     Risk Assessment/Calculations:     Physical Exam:    VS: Vitals:   01/05/23 0841  BP: 132/86  Pulse: 74  SpO2: 99%        BP 132/86   Pulse 74   Ht 5\' 3"  (1.6 m)   Wt 146 lb (66.2 kg)   LMP 12/05/2022 (Within Days)   SpO2 99%   BMI 25.86 kg/m     Wt Readings from Last 3 Encounters:  01/05/23 146 lb (66.2 kg)  12/26/22 147 lb 4 oz (66.8 kg)  04/22/22 144 lb (65.3 kg)     GEN:  Well nourished, well developed in no acute distress HEENT: Normal NECK: No JVD; No carotid bruits LYMPHATICS: No lymphadenopathy CARDIAC: RRR, no murmurs, rubs, gallops RESPIRATORY:  Clear to auscultation without rales, wheezing or rhonchi  ABDOMEN: Soft, non-tender, non-distended MUSCULOSKELETAL:  No edema; No deformity  SKIN: Warm and dry NEUROLOGIC:  Alert and oriented x 3 PSYCHIATRIC:  Normal affect   ASSESSMENT:    CP/Elevated Lp(a): Does endorse some CP with activity. Risk elevated with her Lp(a). Will r/o CAD as the etiology with coronary CTA - can continue asa with higher risk for now  PLAN:    In order of  problems listed above:  Coronary CTA with metoprolol tartrate 50 mg x1 Follow up pending results     Medication Adjustments/Labs and Tests Ordered: Current medicines are reviewed at length with the patient today.  Concerns regarding medicines are outlined above.  Orders Placed This Encounter  Procedures   CT CORONARY MORPH W/CTA COR W/SCORE W/CA W/CM &/OR WO/CM   Basic Metabolic Panel (BMET)   Meds ordered this encounter  Medications   metoprolol tartrate (LOPRESSOR) 50 MG tablet    Sig: Take 2 hours before CT scan    Dispense:  1 tablet    Refill:  0    Patient Instructions  Medication Instructions:  Your physician recommends that you continue on your current medications as directed. Please refer to the Current Medication list given to you today.  *If you need a refill on your cardiac medications before your next appointment, please call your pharmacy*   Lab Work: Your physician recommends that you return for lab work 1 week prior to your CTA to have the following lab drawn: BMET  If you have labs (blood work) drawn today and your tests are completely normal, you will receive your results only by: [place] (if you have MyChart) OR A paper copy in the mail If you have any lab test that is abnormal or we need to change your treatment, we will call you to review the results.   Testing/Procedures: Cardiac CT Angiography (CTA), is a special type of CT scan that uses a computer to produce multi-dimensional views of major blood vessels throughout the body. In CT angiography, a contrast material is injected through an IV to help visualize the blood vessels    Follow-Up: At [facility], you and your health needs are our priority.  As part of our continuing mission to provide you with exceptional heart care, we have created designated Provider Care Teams.  These Care Teams include your primary Cardiologist (physician) and Advanced Practice Providers (APPs -  Physician  Assistants and Nurse Practitioners) who all work together to provide you with the care you need, when you need it.  We recommend signing up for the patient portal called "MyChart".  Sign up information is provided on this After Visit Summary.  MyChart is used to connect with patients for Virtual Visits (Telemedicine).  Patients are able to view lab/test results, encounter notes, upcoming appointments, etc.  Non-urgent messages can be sent to your provider as well.   To learn more about what you can do with MyChart, go to [email or web address].    Your next appointment:   As Needed pending results   Provider:   Janina Mayo, MD     Other Instructions   Your cardiac CT will be scheduled at one of the below locations:   [facility] [address] [place], [place] [address] [phone]  If scheduled at [facility], please arrive at the [facility] and Children's Entrance (Entrance C2) of [facility] 30 minutes prior to test start time. You can use the FREE valet parking offered at entrance C (encouraged to control the heart rate for the test)  Proceed to the [facility] Radiology Department (first floor) to check-in and test prep.  All radiology patients and guests should use entrance C2 at [facility], accessed from [facility], even though the hospital's physical address listed is [address]     Please follow these instructions carefully (unless otherwise directed):   On the Night Before the Test: Be sure to Drink plenty of water. Do not consume any caffeinated/decaffeinated beverages or chocolate 12 hours prior to your test. Do not take any antihistamines 12 hours prior to your test.   On the Day of the Test: Drink plenty of water until 1 hour prior to the test. Do not eat any food 1 hour prior to test. You may take your regular medications prior to the test.  Take metoprolol (Lopressor) 50mg  two hours prior to  test. If you take Furosemide/Hydrochlorothiazide/Spironolactone, please HOLD on the morning of the test. FEMALES- please wear underwire-free bra if available, avoid dresses & tight clothing  After the Test: Drink plenty of water. After receiving IV contrast, you may experience a mild flushed feeling. This is normal. On occasion, you may experience a mild rash up to 24 hours after the test. This is not dangerous. If this occurs, you can take Benadryl 25 mg and increase your fluid intake. If you experience trouble breathing, this can be serious. If it is severe call 911 IMMEDIATELY. If it is mild, please call our office. If you take any of these medications: Glipizide/Metformin, Avandament, Glucavance, please do not take 48 hours after completing test unless otherwise instructed.  We will call to schedule your test 2-4 weeks out understanding that some insurance companies will need an authorization prior to the service being performed.   For non-scheduling related questions, please contact the cardiac imaging nurse navigator should you have any questions/concerns: Marchia Bond, Cardiac Imaging Nurse Navigator Gordy Clement, Cardiac Imaging Nurse Navigator [place] [facility] Direct Office Dial: [phone]   For scheduling needs, including cancellations and rescheduling, please call [place], [phone].     Signed, Janina Mayo, MD  01/05/2023 9:24 AM    [place]

## 2023-01-05 ENCOUNTER — Encounter: Payer: Self-pay | Admitting: Internal Medicine

## 2023-01-05 ENCOUNTER — Ambulatory Visit: Payer: BC Managed Care – PPO | Attending: Internal Medicine | Admitting: Internal Medicine

## 2023-01-05 VITALS — BP 132/86 | HR 74 | Ht 63.0 in | Wt 146.0 lb

## 2023-01-05 DIAGNOSIS — Z01812 Encounter for preprocedural laboratory examination: Secondary | ICD-10-CM | POA: Diagnosis not present

## 2023-01-05 DIAGNOSIS — R072 Precordial pain: Secondary | ICD-10-CM

## 2023-01-05 MED ORDER — METOPROLOL TARTRATE 50 MG PO TABS: ORAL_TABLET | ORAL | 0 refills | Status: DC

## 2023-01-05 NOTE — Patient Instructions (Addendum)
Medication Instructions:  Your physician recommends that you continue on your current medications as directed. Please refer to the Current Medication list given to you today.  *If you need a refill on your cardiac medications before your next appointment, please call your pharmacy*   Lab Work: Your physician recommends that you return for lab work 1 week prior to your CTA to have the following lab drawn: BMET  If you have labs (blood work) drawn today and your tests are completely normal, you will receive your results only by: [place] (if you have MyChart) OR A paper copy in the mail If you have any lab test that is abnormal or we need to change your treatment, we will call you to review the results.   Testing/Procedures: Cardiac CT Angiography (CTA), is a special type of CT scan that uses a computer to produce multi-dimensional views of major blood vessels throughout the body. In CT angiography, a contrast material is injected through an IV to help visualize the blood vessels    Follow-Up: At [facility], you and your health needs are our priority.  As part of our continuing mission to provide you with exceptional heart care, we have created designated Provider Care Teams.  These Care Teams include your primary Cardiologist (physician) and Advanced Practice Providers (APPs -  Physician Assistants and Nurse Practitioners) who all work together to provide you with the care you need, when you need it.  We recommend signing up for the patient portal called "MyChart".  Sign up information is provided on this After Visit Summary.  MyChart is used to connect with patients for Virtual Visits (Telemedicine).  Patients are able to view lab/test results, encounter notes, upcoming appointments, etc.  Non-urgent messages can be sent to your provider as well.   To learn more about what you can do with MyChart, go to [email or web address].    Your next appointment:   As Needed pending  results   Provider:   Janina Mayo, MD     Other Instructions   Your cardiac CT will be scheduled at one of the below locations:   [facility] [address] [place], [place] [address] [phone]  If scheduled at [facility], please arrive at the [facility] and Children's Entrance (Entrance C2) of [facility] 30 minutes prior to test start time. You can use the FREE valet parking offered at entrance C (encouraged to control the heart rate for the test)  Proceed to the [facility] Radiology Department (first floor) to check-in and test prep.  All radiology patients and guests should use entrance C2 at [facility], accessed from [facility], even though the hospital's physical address listed is [address]     Please follow these instructions carefully (unless otherwise directed):   On the Night Before the Test: Be sure to Drink plenty of water. Do not consume any caffeinated/decaffeinated beverages or chocolate 12 hours prior to your test. Do not take any antihistamines 12 hours prior to your test.   On the Day of the Test: Drink plenty of water until 1 hour prior to the test. Do not eat any food 1 hour prior to test. You may take your regular medications prior to the test.  Take metoprolol (Lopressor) 50mg  two hours prior to test. If you take Furosemide/Hydrochlorothiazide/Spironolactone, please HOLD on the morning of the test. FEMALES- please wear underwire-free bra if available, avoid dresses & tight clothing  After the Test: Drink plenty of water. After receiving IV contrast, you may experience a mild flushed feeling. This is normal. On occasion, you may experience a mild rash up to 24 hours after the test. This is not dangerous. If this occurs, you can take Benadryl 25 mg and increase your fluid intake. If you experience trouble breathing, this can be serious. If it is severe call 911 IMMEDIATELY. If  it is mild, please call our office. If you take any of these medications: Glipizide/Metformin, Avandament, Glucavance, please do not take 48 hours after completing test unless otherwise instructed.  We will call to schedule your test 2-4 weeks out understanding that some insurance companies will need an authorization prior to the service being performed.   For non-scheduling related questions, please contact the cardiac imaging nurse navigator should you have any questions/concerns: Marchia Bond, Cardiac Imaging Nurse Navigator Gordy Clement, Cardiac Imaging Nurse Navigator [place] [facility] Direct Office Dial: [phone]   For scheduling needs, including cancellations and rescheduling, please call [place], [phone].

## 2023-01-06 ENCOUNTER — Other Ambulatory Visit: Payer: Self-pay

## 2023-01-06 DIAGNOSIS — Z01812 Encounter for preprocedural laboratory examination: Secondary | ICD-10-CM

## 2023-01-07 LAB — BASIC METABOLIC PANEL
BUN/Creatinine Ratio: 16 (ref 9–23)
BUN: 11 mg/dL (ref 6–24)
CO2: 24 mmol/L (ref 20–29)
Calcium: 9.9 mg/dL (ref 8.7–10.2)
Chloride: 103 mmol/L (ref 96–106)
Creatinine, Ser: 0.69 mg/dL (ref 0.57–1.00)
Glucose: 90 mg/dL (ref 70–99)
Potassium: 4.4 mmol/L (ref 3.5–5.2)
Sodium: 140 mmol/L (ref 134–144)
eGFR: 109 mL/min/{1.73_m2} (ref >59)

## 2023-01-11 ENCOUNTER — Telehealth (HOSPITAL_COMMUNITY): Payer: Self-pay | Admitting: *Deleted

## 2023-01-11 NOTE — Telephone Encounter (Signed)
Reaching out to patient to offer assistance regarding upcoming cardiac imaging study; pt verbalizes understanding of appt date/time, parking situation and where to check in, pre-test NPO status and medications ordered, and verified current allergies; name and call back number provided for further questions should they arise  Cola Highfill RN Navigator Cardiac Imaging [facility] [phone] office [phone] cell  Patient to take 50mg metoprolol tartrate two hours prior to her cardiac CT scan.  She is aware to arrive at 9am. 

## 2023-01-12 ENCOUNTER — Ambulatory Visit (HOSPITAL_COMMUNITY)
Admission: RE | Admit: 2023-01-12 | Discharge: 2023-01-12 | Disposition: A | Discharge status: Home / Self Care | Payer: BC Managed Care – PPO | Source: Ambulatory Visit | Attending: Internal Medicine | Admitting: Internal Medicine

## 2023-01-12 DIAGNOSIS — R072 Precordial pain: Secondary | ICD-10-CM | POA: Diagnosis not present

## 2023-01-12 MED ORDER — NITROGLYCERIN 0.4 MG SL SUBL
0.8000 mg | SUBLINGUAL_TABLET | Freq: Once | SUBLINGUAL | Status: AC
  Administered 2023-01-12: 0.8 mg via SUBLINGUAL

## 2023-01-12 MED ORDER — IOHEXOL 350 MG/ML SOLN
100.0000 mL | Freq: Once | INTRAVENOUS | Status: AC | PRN
  Administered 2023-01-12: 100 mL via INTRAVENOUS

## 2023-01-12 MED ORDER — NITROGLYCERIN 0.4 MG SL SUBL
SUBLINGUAL_TABLET | SUBLINGUAL | Status: AC
  Filled 2023-01-12: qty 2

## 2023-01-12 MED ORDER — METOPROLOL TARTRATE 5 MG/5ML IV SOLN
5.0000 mg | INTRAVENOUS | Status: DC | PRN
  Administered 2023-01-12: 5 mg via INTRAVENOUS

## 2023-01-16 ENCOUNTER — Other Ambulatory Visit: Payer: Self-pay

## 2023-04-24 ENCOUNTER — Other Ambulatory Visit: Payer: Self-pay | Admitting: Family Medicine

## 2023-04-24 DIAGNOSIS — Z1231 Encounter for screening mammogram for malignant neoplasm of breast: Secondary | ICD-10-CM

## 2023-04-26 ENCOUNTER — Ambulatory Visit (INDEPENDENT_AMBULATORY_CARE_PROVIDER_SITE_OTHER): Payer: BC Managed Care – PPO | Admitting: Obstetrics & Gynecology

## 2023-04-26 ENCOUNTER — Encounter: Payer: Self-pay | Admitting: Obstetrics & Gynecology

## 2023-04-26 VITALS — BP 126/82 | HR 80 | Ht 61.5 in | Wt 143.0 lb

## 2023-04-26 DIAGNOSIS — Z3041 Encounter for surveillance of contraceptive pills: Secondary | ICD-10-CM

## 2023-04-26 DIAGNOSIS — Z01419 Encounter for gynecological examination (general) (routine) without abnormal findings: Secondary | ICD-10-CM | POA: Diagnosis not present

## 2023-04-26 MED ORDER — NORETHINDRONE 0.35 MG PO TABS: 1.0000 | ORAL_TABLET | Freq: Every day | ORAL | 4 refills | Status: DC

## 2023-04-26 NOTE — Progress Notes (Signed)
Jessica Acosta 1976-02-01 [id number]   History:    47 y.o. G3P3L3 Married.  Promoter to Curriculum counselor for teachers.  Son Phineas Semen 25 yo, daughter Jaclynn Guarneri 30 yo, son Olyver 1 yo   RP:  Established patient presenting for annual gyn exam   HPI: Well on BCPs Orsythia 0.1/20.  No BTB.  No pelvic pain.  No pain with IC. Husband is scheduled for a Vasectomy 05/10/23.  Pap Neg 04/2022.  No h/o abnormal Pap.  Repeat Pap at 3 years.  Urine/BMs normal.  Breasts normal.  Mammo Neg 04/2021, scheduled 05/09/23. BMI 26.58. Good fitness and healthy nutrition. Health labs with Fam MD.   Past medical history,surgical history, family history and social history were all reviewed and documented in the EPIC chart.  Gynecologic History Patient's last menstrual period was 04/26/2023.  Obstetric History OB History  Gravida Para Term Preterm AB Living  3 3 3     3   SAB IAB Ectopic Multiple Live Births          3    # Outcome Date GA Lbr Len/2nd Weight Sex Delivery Anes PTL Lv  3 Term 08/31/11 [redacted]w[redacted]d 01:19 / 00:02 7 lb 3.7 oz (3.28 kg) M Vag-Spont Local  LIV     Birth Comments: no anamolies noted  2 Term           1 Term              ROS: A ROS was performed and pertinent positives and negatives are included in the history. GENERAL: No fevers or chills. HEENT: No change in vision, no earache, sore throat or sinus congestion. NECK: No pain or stiffness. CARDIOVASCULAR: No chest pain or pressure. No palpitations. PULMONARY: No shortness of breath, cough or wheeze. GASTROINTESTINAL: No abdominal pain, nausea, vomiting or diarrhea, melena or bright red blood per rectum. GENITOURINARY: No urinary frequency, urgency, hesitancy or dysuria. MUSCULOSKELETAL: No joint or muscle pain, no back pain, no recent trauma. DERMATOLOGIC: No rash, no itching, no lesions. ENDOCRINE: No polyuria, polydipsia, no heat or cold intolerance. No recent change in weight. HEMATOLOGICAL: No anemia or easy bruising or bleeding.  NEUROLOGIC: No headache, seizures, numbness, tingling or weakness. PSYCHIATRIC: No depression, no loss of interest in normal activity or change in sleep pattern.     Exam:   BP 126/82 (BP Location: Right Arm, Patient Position: Sitting, Cuff Size: Normal)   Pulse 80   Ht 5' 1.5" (1.562 m)   Wt 143 lb (64.9 kg)   LMP 04/26/2023   SpO2 98%   BMI 26.58 kg/m   Body mass index is 26.58 kg/m.  General appearance : Well developed well nourished female. No acute distress HEENT: Eyes: no retinal hemorrhage or exudates,  Neck supple, trachea midline, no carotid bruits, no thyroidmegaly Lungs: Clear to auscultation, no rhonchi or wheezes, or rib retractions  Heart: Regular rate and rhythm, no murmurs or gallops Breast:Examined in sitting and supine position were symmetrical in appearance, no palpable masses or tenderness,  no skin retraction, no nipple inversion, no nipple discharge, no skin discoloration, no axillary or supraclavicular lymphadenopathy Abdomen: no palpable masses or tenderness, no rebound or guarding Extremities: no edema or skin discoloration or tenderness  Pelvic: Vulva: Normal             Vagina: No gross lesions or discharge  Cervix: No gross lesions or discharge  Uterus  AV, normal size, shape and consistency, non-tender and mobile  Adnexa  Without masses or tenderness  Anus: Normal   Assessment/Plan:  47 y.o. female for annual exam   1. Well female exam with routine gynecological exam Well on BCPs Orsythia 0.1/20.  No BTB.  No pelvic pain.  No pain with IC. Husband is scheduled for a Vasectomy 05/10/23.  Pap Neg 04/2022.  No h/o abnormal Pap.  Repeat Pap at 3 years.  Urine/BMs normal.  Breasts normal.  Mammo Neg 04/2021, scheduled 05/09/23. BMI 26.58. Good fitness and healthy nutrition. Health labs with Fam MD.  2. Encounter for surveillance of contraceptive pills Well on BCPs Orsythia 0.1/20.  No BTB.  No pelvic pain.  No pain with IC. Husband is scheduled for a  Vasectomy 05/10/23.  Will continue on Orsythia until the Vasectomy.  Given that she has Cardiovascular risks including hyperlipidemia, decision to switch to the Progestin only BCP until the Vasectomy has been documented efficient.  Condoms recommended during Progestin only BCPs.  Prescription sent to pharmacy.  Other orders - norethindrone (MICRONOR) 0.35 MG tablet; Take 1 tablet (0.35 mg total) by mouth daily.   Genia Del MD, 9:05 AM

## 2023-05-09 ENCOUNTER — Ambulatory Visit
Admission: RE | Admit: 2023-05-09 | Discharge: 2023-05-09 | Disposition: A | Discharge status: Home / Self Care | Payer: BC Managed Care – PPO | Source: Ambulatory Visit | Attending: Family Medicine | Admitting: Family Medicine

## 2023-05-09 DIAGNOSIS — Z1231 Encounter for screening mammogram for malignant neoplasm of breast: Secondary | ICD-10-CM

## 2023-05-16 ENCOUNTER — Other Ambulatory Visit: Payer: Self-pay | Admitting: Obstetrics & Gynecology

## 2023-08-11 ENCOUNTER — Ambulatory Visit: Payer: BC Managed Care – PPO | Admitting: Family Medicine

## 2023-08-11 VITALS — BP 144/82 | HR 80 | Temp 98.1°F | Ht 62.0 in | Wt 145.0 lb

## 2023-08-11 DIAGNOSIS — R0789 Other chest pain: Secondary | ICD-10-CM

## 2023-08-11 DIAGNOSIS — R030 Elevated blood-pressure reading, without diagnosis of hypertension: Secondary | ICD-10-CM | POA: Diagnosis not present

## 2023-08-11 DIAGNOSIS — Z1159 Encounter for screening for other viral diseases: Secondary | ICD-10-CM

## 2023-08-11 DIAGNOSIS — Z1211 Encounter for screening for malignant neoplasm of colon: Secondary | ICD-10-CM

## 2023-08-11 DIAGNOSIS — Z0000 Encounter for general adult medical examination without abnormal findings: Secondary | ICD-10-CM

## 2023-08-11 DIAGNOSIS — E7841 Elevated Lipoprotein(a): Secondary | ICD-10-CM

## 2023-08-11 DIAGNOSIS — Z23 Encounter for immunization: Secondary | ICD-10-CM | POA: Diagnosis not present

## 2023-08-11 DIAGNOSIS — Z01419 Encounter for gynecological examination (general) (routine) without abnormal findings: Secondary | ICD-10-CM

## 2023-08-11 LAB — COMPREHENSIVE METABOLIC PANEL
ALT: 23 U/L (ref 0–35)
AST: 16 U/L (ref 0–37)
Albumin: 4.7 g/dL (ref 3.5–5.2)
Alkaline Phosphatase: 71 U/L (ref 39–117)
BUN: 12 mg/dL (ref 6–23)
CO2: 28 meq/L (ref 19–32)
Calcium: 9.7 mg/dL (ref 8.4–10.5)
Chloride: 105 meq/L (ref 96–112)
Creatinine, Ser: 0.79 mg/dL (ref 0.40–1.20)
GFR: 90.18 mL/min (ref >60.00)
Glucose, Bld: 100 mg/dL — ABNORMAL HIGH (ref 70–99)
Potassium: 5 meq/L (ref 3.5–5.1)
Sodium: 141 meq/L (ref 135–145)
Total Bilirubin: 0.5 mg/dL (ref 0.2–1.2)
Total Protein: 7.2 g/dL (ref 6.0–8.3)

## 2023-08-11 LAB — CBC WITH DIFFERENTIAL/PLATELET
Absolute Lymphocytes: 3932 {cells}/uL — ABNORMAL HIGH (ref 850–3900)
Absolute Monocytes: 1209 {cells}/uL — ABNORMAL HIGH (ref 200–950)
Basophils Absolute: 46 {cells}/uL (ref 0–200)
Basophils Relative: 0.3 %
Eosinophils Absolute: 31 {cells}/uL (ref 15–500)
Eosinophils Relative: 0.2 %
HCT: 40.9 % (ref 35.0–45.0)
Hemoglobin: 13.5 g/dL (ref 11.7–15.5)
MCH: 29.5 pg (ref 27.0–33.0)
MCHC: 33 g/dL (ref 32.0–36.0)
MCV: 89.5 fL (ref 80.0–100.0)
MPV: 9.4 fL (ref 7.5–12.5)
Monocytes Relative: 7.9 %
Neutro Abs: 10083 {cells}/uL — ABNORMAL HIGH (ref 1500–7800)
Neutrophils Relative %: 65.9 %
Platelets: 347 10*3/uL (ref 140–400)
RBC: 4.57 10*6/uL (ref 3.80–5.10)
RDW: 12.2 % (ref 11.0–15.0)
Total Lymphocyte: 25.7 %
WBC: 15.3 10*3/uL — ABNORMAL HIGH (ref 3.8–10.8)

## 2023-08-11 LAB — LIPID PANEL
Cholesterol: 162 mg/dL (ref 0–200)
HDL: 63 mg/dL (ref >39.00)
LDL Cholesterol: 88 mg/dL (ref 0–99)
NonHDL: 99.4
Total CHOL/HDL Ratio: 3
Triglycerides: 57 mg/dL (ref 0.0–149.0)
VLDL: 11.4 mg/dL (ref 0.0–40.0)

## 2023-08-11 LAB — CK: Total CK: 33 U/L (ref 7–177)

## 2023-08-11 MED ORDER — ROSUVASTATIN CALCIUM 10 MG PO TABS: 10.0000 mg | ORAL_TABLET | Freq: Every day | ORAL | 3 refills | Status: DC

## 2023-08-11 NOTE — Assessment & Plan Note (Signed)
Ongoing noted post-exertion. Had reassuring cardiac evaluation 12/2022 including coronary CTA, normal D-dimer as well. Discussed exertional chest tightness. Consider BB.

## 2023-08-11 NOTE — Progress Notes (Addendum)
Ph: [phone] Fax: [phone]   Patient ID: Jessica Acosta, female    DOB: [birth year], 47 y.o.   MRN: [id number]  This visit was conducted in person.  BP (!) 144/82   Pulse 80   Temp 98.1 F (36.7 C) (Oral)   Ht 5\' 2"  (1.575 m)   Wt 145 lb (65.8 kg)   LMP 08/08/2023   SpO2 99%   BMI 26.52 kg/m   154/100 on repeat testing At home running 120/80s.   CC: CPE Subjective:   HPI: Jessica Acosta is a 47 y.o. female presenting on 08/11/2023 for No chief complaint on file.   Notes ongoing chest discomfort, worse after she works out especially when heart rate drops quickly. She has extended cool down period which as helped. She saw cardiology Dr Wyline Mood 12/2022 - coronary CTA was reassuring with coronary calcium score of zero at that time. Took off aspirin, but continued lipitor 40mg  daily.  No new joint pains, no new rashes.   HLD with elevated Lp(a) - 12/2022 started lipitor 40mg  daily - then noted diminished grip strength due to pain/weakness. She dropped dose to 2x/wk, notes ongoing difficulty with this the day after the dose.   Preventative: Colon cancer screen - discussed options, agrees to colonoscopy referral Well woman yearly with OBGYN Dr. Seymour Bars, last seen 04/2023. All normal paps, latest 04/2022. OCP Micronor through GYN - planning to stop once husband has vasectomy.  GYN has moved - planning to find new gynecologist. Requests referral to [facility].  Mammogram 04/2023 - [facility]  LMP - this week Flu shot - yearly  COVID vaccine - Pfizer 12/2019 x2, booster x1  Tdap 08/2011 , rpt today Seat belt use discussed Sunscreen use discussed. No changing moles on skin Averaging 6-8 hours of sleep/day.  Ex-smoker - quit 2005  Alcohol - social on weekends Dentist q6 mo Eye exam yearly    1 cup coffee in am Lives with husband Jessica Acosta and 3 children, 1 dog, 1 cat  Occupation: Paramedic - curriculum facilitator  Edu:  master's degree  Activity: enjoys tennis, elliptical and weight machines at [employer]  Diet: good water, fruits/vegetables daily      Relevant past medical, surgical, family and social history reviewed and updated as indicated. Interim medical history since our last visit reviewed. Allergies and medications reviewed and updated. Outpatient Medications Prior to Visit  Medication Sig Dispense Refill   hydrOXYzine (ATARAX) 25 MG tablet Take 0.5-1 tablets (12.5-25 mg total) by mouth 2 (two) times daily as needed for anxiety (sedation precautions). 30 tablet 0   Multiple Vitamin (MULTIVITAMIN) tablet Take 1 tablet by mouth daily.     norethindrone (MICRONOR) 0.35 MG tablet Take 1 tablet (0.35 mg total) by mouth daily. 84 tablet 4   Probiotic Product (PROBIOTIC PO) Take by mouth. As directed     atorvastatin (LIPITOR) 40 MG tablet Take 1 tablet (40 mg total) by mouth daily. 90 tablet 3   No facility-administered medications prior to visit.     Per HPI unless specifically indicated in ROS section below Review of Systems  Constitutional:  Negative for activity change, appetite change, chills, fatigue, fever and unexpected weight change.  HENT:  Negative for hearing loss.   Eyes:  Negative for visual disturbance.  Respiratory:  Negative for cough, chest tightness, shortness of breath and wheezing.   Cardiovascular:  Positive for chest pain (see above). Negative for palpitations and leg swelling.  Gastrointestinal:  Negative for abdominal distention, abdominal pain, blood in stool, constipation, diarrhea, nausea and vomiting.  Genitourinary:  Negative for difficulty urinating and hematuria.  Musculoskeletal:  Negative for arthralgias, myalgias and neck pain.  Skin:  Negative for rash.  Neurological:  Negative for dizziness, seizures, syncope and headaches.  Hematological:  Negative for adenopathy. Does not bruise/bleed easily.  Psychiatric/Behavioral:  Negative for dysphoric mood. The patient is not  nervous/anxious.     Objective:  BP (!) 144/82   Pulse 80   Temp 98.1 F (36.7 C) (Oral)   Ht 5\' 2"  (1.575 m)   Wt 145 lb (65.8 kg)   LMP 08/08/2023   SpO2 99%   BMI 26.52 kg/m   Wt Readings from Last 3 Encounters:  08/11/23 145 lb (65.8 kg)  04/26/23 143 lb (64.9 kg)  01/05/23 146 lb (66.2 kg)      Physical Exam Vitals and nursing note reviewed.  Constitutional:      Appearance: Normal appearance. She is not ill-appearing.  HENT:     Head: Normocephalic and atraumatic.     Right Ear: Tympanic membrane, ear canal and external ear normal. There is no impacted cerumen.     Left Ear: Tympanic membrane, ear canal and external ear normal. There is no impacted cerumen.     Mouth/Throat:     Mouth: Mucous membranes are moist.     Pharynx: Oropharynx is clear. No oropharyngeal exudate or posterior oropharyngeal erythema.  Eyes:     General:        Right eye: No discharge.        Left eye: No discharge.     Extraocular Movements: Extraocular movements intact.     Conjunctiva/sclera: Conjunctivae normal.     Pupils: Pupils are equal, round, and reactive to light.  Neck:     Thyroid: No thyroid mass or thyromegaly.  Cardiovascular:     Rate and Rhythm: Normal rate and regular rhythm.     Pulses: Normal pulses.     Heart sounds: Normal heart sounds. No murmur heard. Pulmonary:     Effort: Pulmonary effort is normal. No respiratory distress.     Breath sounds: Normal breath sounds. No wheezing, rhonchi or rales.     Comments: No reproducible chest wall pain Chest:     Chest wall: No tenderness.  Abdominal:     General: Bowel sounds are normal. There is no distension.     Palpations: Abdomen is soft. There is no mass.     Tenderness: There is no abdominal tenderness. There is no guarding or rebound.     Hernia: No hernia is present.  Musculoskeletal:     Cervical back: Normal range of motion and neck supple. No rigidity.     Right lower leg: No edema.     Left lower leg:  No edema.  Lymphadenopathy:     Cervical: No cervical adenopathy.  Skin:    General: Skin is warm and dry.     Findings: No rash.  Neurological:     General: No focal deficit present.     Mental Status: She is alert. Mental status is at baseline.  Psychiatric:        Mood and Affect: Mood normal.        Behavior: Behavior normal.       Results for orders placed or performed in visit on 01/06/23  Basic Metabolic Panel (BMET)  Result Value Ref Range   Glucose 90 70 - 99 mg/dL  BUN 11 6 - 24 mg/dL   Creatinine, Ser 4.74 0.57 - 1.00 mg/dL   eGFR 259 >56 LO/VFI/4.33   BUN/Creatinine Ratio 16 9 - 23   Sodium 140 134 - 144 mmol/L   Potassium 4.4 3.5 - 5.2 mmol/L   Chloride 103 96 - 106 mmol/L   CO2 24 20 - 29 mmol/L   Calcium 9.9 8.7 - 10.2 mg/dL    Assessment & Plan:   Problem List Items Addressed This Visit     Health care maintenance - Primary (Chronic)    Preventative protocols reviewed and updated unless pt declined. Discussed healthy diet and lifestyle.       Chest discomfort    Ongoing noted post-exertion. Had reassuring cardiac evaluation 12/2022 including coronary CTA, normal D-dimer as well. Discussed exertional chest tightness. Consider BB.       Relevant Orders   CBC with Differential/Platelet   Hyperlipidemia    Chronic, elevated Lp(a). Had trouble tolerating atorvastatin due to grip weakness due to pain/weakness. Stop atorvastatin, trial rosuvastatin 10mg  daily.  The 10-year ASCVD risk score (Arnett DK, et al., 2019) is: 0.9%   Values used to calculate the score:     Age: 89 years     Sex: Female     Is Non-Hispanic African American: No     Diabetic: No     Tobacco smoker: No     Systolic Blood Pressure: 144 mmHg     Is BP treated: No     HDL Cholesterol: 73.7 mg/dL     Total Cholesterol: 245 mg/dL       Relevant Medications   rosuvastatin (CRESTOR) 10 MG tablet   Other Relevant Orders   Lipid panel   Comprehensive metabolic panel   CK    Elevated blood pressure reading in office without diagnosis of hypertension    Reviewed elevated BP - however home readings remain well controlled. Reviewed optimal strategy to check BP at home. Rec start monitoring BP at home, let me know if consistently above >140/90, would start BB at that time.       Other Visit Diagnoses     Need for hepatitis C screening test       Relevant Orders   Hepatitis C antibody   Well female exam with routine gynecological exam       Relevant Orders   Ambulatory referral to Gynecology   Encounter for immunization       Relevant Orders   Flu vaccine trivalent PF, 6mos and older(Flulaval,Afluria,Fluarix,Fluzone) (Completed)   Tdap vaccine greater than or equal to 7yo IM (Completed)   Special screening for malignant neoplasms, colon       Relevant Orders   Ambulatory referral to Gastroenterology        Meds ordered this encounter  Medications   rosuvastatin (CRESTOR) 10 MG tablet    Sig: Take 1 tablet (10 mg total) by mouth daily.    Dispense:  90 tablet    Refill:  3    Orders Placed This Encounter  Procedures   Flu vaccine trivalent PF, 6mos and older(Flulaval,Afluria,Fluarix,Fluzone)   Tdap vaccine greater than or equal to 7yo IM   Lipid panel   Comprehensive metabolic panel   Hepatitis C antibody   CK   CBC with Differential/Platelet   Ambulatory referral to Gynecology    Referral Priority:   Routine    Referral Type:   Consultation    Referral Reason:   Specialty Services Required    Requested Specialty:  Gynecology    Number of Visits Requested:   1   Ambulatory referral to Gastroenterology    Referral Priority:   Routine    Referral Type:   Consultation    Referral Reason:   Specialty Services Required    Number of Visits Requested:   1    Patient Instructions  Labs today  Flu and tetanus shots today (Tdap) We will refer you to local [facility] GYN  Continue monitoring blood pressures at home, let me know if  consistently >140/90, bring in BP cuff to next visit to compare.  Good to see you today Return as needed or in 1 year for next physical.   Follow up plan: Return in about 1 year (around 08/10/2024) for annual exam, prior fasting for blood work.  Eustaquio Boyden, MD

## 2023-08-11 NOTE — Patient Instructions (Addendum)
Labs today  Flu and tetanus shots today (Tdap) We will refer you to local [facility] GYN  Continue monitoring blood pressures at home, let me know if consistently >140/90, bring in BP cuff to next visit to compare.  Good to see you today Return as needed or in 1 year for next physical.

## 2023-08-11 NOTE — Assessment & Plan Note (Signed)
Preventative protocols reviewed and updated unless pt declined. Discussed healthy diet and lifestyle.  

## 2023-08-11 NOTE — Addendum Note (Signed)
Addended by: Vincenza Hews on: 08/11/2023 08:55 AM   Modules accepted: Orders

## 2023-08-11 NOTE — Assessment & Plan Note (Addendum)
Chronic, elevated Lp(a). Had trouble tolerating atorvastatin due to grip weakness due to pain/weakness. Stop atorvastatin, trial rosuvastatin 10mg  daily.  The 10-year ASCVD risk score (Arnett DK, et al., 2019) is: 0.9%   Values used to calculate the score:     Age: 47 years     Sex: Female     Is Non-Hispanic African American: No     Diabetic: No     Tobacco smoker: No     Systolic Blood Pressure: 144 mmHg     Is BP treated: No     HDL Cholesterol: 73.7 mg/dL     Total Cholesterol: 245 mg/dL

## 2023-08-11 NOTE — Assessment & Plan Note (Signed)
Reviewed elevated BP - however home readings remain well controlled. Reviewed optimal strategy to check BP at home. Rec start monitoring BP at home, let me know if consistently above >140/90, would start BB at that time.

## 2023-08-12 LAB — HEPATITIS C ANTIBODY: Hepatitis C Ab: NONREACTIVE

## 2023-08-15 ENCOUNTER — Other Ambulatory Visit: Payer: Self-pay | Admitting: Family Medicine

## 2023-08-15 ENCOUNTER — Encounter: Payer: Self-pay | Admitting: Family Medicine

## 2023-08-15 ENCOUNTER — Telehealth: Payer: Self-pay | Admitting: Family Medicine

## 2023-08-15 DIAGNOSIS — D72829 Elevated white blood cell count, unspecified: Secondary | ICD-10-CM

## 2023-08-15 DIAGNOSIS — R0789 Other chest pain: Secondary | ICD-10-CM

## 2023-08-15 NOTE — Telephone Encounter (Signed)
Left message to return call to our office.  

## 2023-08-15 NOTE — Telephone Encounter (Signed)
Pt called in responding to her MyChart message from [facility] pt wants to know if the vaccine could have played apart in the results making pt blood count high pt would like a called back as so as possible

## 2023-08-15 NOTE — Telephone Encounter (Signed)
II don't think it would have affected the blood work so soon after the vaccine.

## 2023-08-16 NOTE — Telephone Encounter (Signed)
Spoke with pt relaying Dr Timoteo Expose message. Pt verbalizes understanding. States she just considered the shot since she's not having signs of infection Dr Reece Agar asked about.   Pt scheduled CXR and rpt labs on 11/15/204 at 3:15.

## 2023-09-01 ENCOUNTER — Ambulatory Visit (INDEPENDENT_AMBULATORY_CARE_PROVIDER_SITE_OTHER)
Admission: RE | Admit: 2023-09-01 | Discharge: 2023-09-01 | Disposition: A | Discharge status: Home / Self Care | Payer: BC Managed Care – PPO | Source: Ambulatory Visit | Attending: Family Medicine | Admitting: Family Medicine

## 2023-09-01 ENCOUNTER — Other Ambulatory Visit (INDEPENDENT_AMBULATORY_CARE_PROVIDER_SITE_OTHER): Payer: BC Managed Care – PPO

## 2023-09-01 DIAGNOSIS — R0789 Other chest pain: Secondary | ICD-10-CM

## 2023-09-01 DIAGNOSIS — D72829 Elevated white blood cell count, unspecified: Secondary | ICD-10-CM

## 2023-09-01 NOTE — Addendum Note (Signed)
Addended by: Alvina Chou on: 09/01/2023 03:12 PM   Modules accepted: Orders

## 2023-09-04 ENCOUNTER — Encounter: Payer: Self-pay | Admitting: Family Medicine

## 2023-09-04 DIAGNOSIS — M419 Scoliosis, unspecified: Secondary | ICD-10-CM | POA: Insufficient documentation

## 2023-09-04 LAB — CBC WITH DIFFERENTIAL/PLATELET
Absolute Lymphocytes: 3912 {cells}/uL — ABNORMAL HIGH (ref 850–3900)
Absolute Monocytes: 927 {cells}/uL (ref 200–950)
Basophils Absolute: 25 {cells}/uL (ref 0–200)
Basophils Relative: 0.2 %
Eosinophils Absolute: 38 {cells}/uL (ref 15–500)
Eosinophils Relative: 0.3 %
HCT: 40 % (ref 35.0–45.0)
Hemoglobin: 13.2 g/dL (ref 11.7–15.5)
MCH: 29.3 pg (ref 27.0–33.0)
MCHC: 33 g/dL (ref 32.0–36.0)
MCV: 88.7 fL (ref 80.0–100.0)
MPV: 9.5 fL (ref 7.5–12.5)
Monocytes Relative: 7.3 %
Neutro Abs: 7798 {cells}/uL (ref 1500–7800)
Neutrophils Relative %: 61.4 %
Platelets: 340 10*3/uL (ref 140–400)
RBC: 4.51 10*6/uL (ref 3.80–5.10)
RDW: 12.2 % (ref 11.0–15.0)
Total Lymphocyte: 30.8 %
WBC: 12.7 10*3/uL — ABNORMAL HIGH (ref 3.8–10.8)

## 2023-09-04 LAB — SEDIMENTATION RATE: Sed Rate: 9 mm/h (ref 0–20)

## 2023-09-04 LAB — PATHOLOGIST SMEAR REVIEW

## 2023-09-20 ENCOUNTER — Encounter: Payer: Self-pay | Admitting: Internal Medicine

## 2023-10-13 ENCOUNTER — Ambulatory Visit (AMBULATORY_SURGERY_CENTER): Payer: BC Managed Care – PPO | Admitting: *Deleted

## 2023-10-13 ENCOUNTER — Telehealth: Payer: Self-pay | Admitting: *Deleted

## 2023-10-13 VITALS — Ht 63.0 in | Wt 140.0 lb

## 2023-10-13 DIAGNOSIS — Z1211 Encounter for screening for malignant neoplasm of colon: Secondary | ICD-10-CM

## 2023-10-13 MED ORDER — NA SULFATE-K SULFATE-MG SULF 17.5-3.13-1.6 GM/177ML PO SOLN: 1.0000 | Freq: Once | ORAL | 0 refills | Status: AC

## 2023-10-13 NOTE — Progress Notes (Signed)
Pt's name and DOB verified at the beginning of the pre-visit wit 2 identifiers  Pt denies any difficulty with ambulating,sitting, laying down or rolling side to side  Pt has no issues with ambulation   Pt has no issues moving head neck or swallowing  No egg or soy allergy known to patient   Patient denies ever being intubated  No FH of Malignant Hyperthermia  Pt is not on diet pills or shots  Pt is not on home 02   Pt is not on blood thinners   Pt denies issues with constipation   Pt is not on dialysis  Pt denise any abnormal heart rhythms   Pt denies any upcoming cardiac testing  Pt encouraged to use to use Singlecare or Goodrx to reduce cost   Patient's chart reviewed by Cathlyn Parsons CNRA prior to pre-visit and patient appropriate for the [facility].  Pre-visit completed and red dot placed by patient's name on their procedure day (on provider's schedule).  .  Visit by phone  Pt states weight is 140 lb  Instructed pt why it is important to and  to call if they have any changes in health or new medications. Directed them to the # given and on instructions.     Instructions reviewed. Pt given both [facility] main # and MD on call # prior to instructions.  Pt states understanding. Instructed to review again prior to procedure. Pt states they will.   Instructions sent by mail with coupon and by My Chart  Coupon sent via text to mobile phone and pt verified they received it

## 2023-10-13 NOTE — Telephone Encounter (Signed)
Attempt to reach pt for pre-visit. LM with call back #.   Will attempt other number in profile Attempt to reach pt for pre-visit. LM with call back #.  A ttempt other number in profile LM. Will attempt to reach pt with first #  Will attempt to reach again in 5 pm  Pt returned call after RN call

## 2023-10-25 ENCOUNTER — Encounter: Payer: Self-pay | Admitting: Internal Medicine

## 2023-10-26 NOTE — Progress Notes (Signed)
 [facility] History and Physical   Primary Care Physician:  Rilla Baller, MD   Reason for Procedure:  Colon cancer screening  Plan:    Colonoscopy     HPI: Jessica Acosta is a 48 y.o. female presenting for an initial screening colonoscopy exam.   Past Medical History:  Diagnosis Date   Anxiety    History of chicken pox    Hyperlipidemia    Migraines    Scoliosis     Past Surgical History:  Procedure Laterality Date   mole removed from ear  10/17/1985   wisdom teeth removal      Prior to Admission medications   Medication Sig Start Date End Date Taking? Authorizing Provider  hydrOXYzine  (ATARAX ) 25 MG tablet Take 0.5-1 tablets (12.5-25 mg total) by mouth 2 (two) times daily as needed for anxiety (sedation precautions). 12/26/22   Rilla Baller, MD  Multiple Vitamin (MULTIVITAMIN) tablet Take 1 tablet by mouth daily.    [provider]  norethindrone  (MICRONOR ) 0.35 MG tablet Take 1 tablet (0.35 mg total) by mouth daily. Patient not taking: Reported on 10/13/2023 04/26/23   Lavoie, Marie-Lyne, MD  Probiotic Product (PROBIOTIC PO) Take by mouth. As directed    [provider]  rosuvastatin  (CRESTOR ) 10 MG tablet Take 1 tablet (10 mg total) by mouth daily. 08/11/23   Rilla Baller, MD    Current Outpatient Medications  Medication Sig Dispense Refill   Probiotic Product (PROBIOTIC PO) Take by mouth. As directed     rosuvastatin  (CRESTOR ) 10 MG tablet Take 1 tablet (10 mg total) by mouth daily. 90 tablet 3   hydrOXYzine  (ATARAX ) 25 MG tablet Take 0.5-1 tablets (12.5-25 mg total) by mouth 2 (two) times daily as needed for anxiety (sedation precautions). 30 tablet 0   Multiple Vitamin (MULTIVITAMIN) tablet Take 1 tablet by mouth daily.     norethindrone  (MICRONOR ) 0.35 MG tablet Take 1 tablet (0.35 mg total) by mouth daily. (Patient not taking: Reported on 10/13/2023) 84 tablet 4   Current Facility-Administered Medications   Medication Dose Route Frequency Provider Last Rate Last Admin   0.9 %  sodium chloride  infusion  500 mL Intravenous Once Avram Lupita BRAVO, MD        Allergies as of 10/27/2023   (No Known Allergies)    Family History  Problem Relation Age of Onset   Colon polyps Mother    Diabetes Mother    Colon polyps Father    Heart disease Father        pacer   Hypertension Father    Hyperlipidemia Father    Diabetes Father    Mitral valve prolapse Father 30   CAD Father 11       stent placement   Clotting disorder Sister        3 clots (ovarian, renal arteries)   Stroke Sister    Hypertension Sister    Colon polyps Brother    Breast cancer Maternal Aunt    Colon polyps Paternal Uncle    Arthritis Maternal Grandmother    Deep vein thrombosis Maternal Grandmother    Breast cancer Maternal Grandmother    CAD Maternal Grandfather 82       MI (smoker)   Diabetes Maternal Grandfather    Cancer Paternal Grandmother        breast   Stroke Paternal Grandmother    Heart disease Paternal Grandmother        CHF   Breast cancer Paternal Grandmother    Diabetes  Paternal Grandmother    CAD Paternal Grandmother    Cancer Paternal Grandfather        prostate   Congestive Heart Failure Paternal Grandfather    Colon cancer Neg Hx    Esophageal cancer Neg Hx    Rectal cancer Neg Hx    Stomach cancer Neg Hx     Social History   Socioeconomic History   Marital status: Married    Spouse name: Not on file   Number of children: Not on file   Years of education: Not on file   Highest education level: Master's degree (e.g., MA, MS, MEng, MEd, MSW, MBA)  Occupational History   Not on file  Tobacco Use   Smoking status: Former    Current packs/day: 0.00    Types: Cigarettes    Quit date: 11/17/2003    Years since quitting: 19.9   Smokeless tobacco: Never  Vaping Use   Vaping status: Never Used  Substance and Sexual Activity   Alcohol use: Yes    Comment: 2 glasses wine occasionally    Drug use: No   Sexual activity: Yes    Partners: Male    Birth control/protection: OCP    Comment: 1st intercourse- 17, partners- 5  Other Topics Concern   Not on file  Social History Narrative   Lives with husband Jessica Acosta and 3 children, 1 dog, 1 cat   Occupation: elem school teacher   Edu: master's degree   Activity: no regular exercise   Diet: good water, fruits/vegetables daily   Social Drivers of Corporate Investment Banker Strain: Low Risk  (08/11/2023)   Overall Financial Resource Strain (CARDIA)    Difficulty of Paying Living Expenses: Not hard at all  Food Insecurity: No Food Insecurity (08/11/2023)   Hunger Vital Sign    Worried About Running Out of Food in the Last Year: Never true    Ran Out of Food in the Last Year: Never true  Transportation Needs: No Transportation Needs (08/11/2023)   PRAPARE - Administrator, Civil Service (Medical): No    Lack of Transportation (Non-Medical): No  Physical Activity: Insufficiently Active (08/11/2023)   Exercise Vital Sign    Days of Exercise per Week: 4 days    Minutes of Exercise per Session: 30 min  Stress: No Stress Concern Present (08/11/2023)   [employer] of Occupational Health - Occupational Stress Questionnaire    Feeling of Stress : Only a little  Social Connections: Unknown (08/11/2023)   Social Connection and Isolation Panel [NHANES]    Frequency of Communication with Friends and Family: More than three times a week    Frequency of Social Gatherings with Friends and Family: Three times a week    Attends Religious Services: Patient declined    Active Member of Clubs or Organizations: No    Attends Engineer, Structural: Not on file    Marital Status: Married  Catering Manager Violence: Not on file    Review of Systems:  All other review of systems negative except as mentioned in the HPI.  Physical Exam: Vital signs BP (!) 142/91 (BP Location: Left Arm, Patient Position:  Sitting, Cuff Size: Normal)   Pulse 92   Temp 97.7 F (36.5 C)   Ht 5' 3 (1.6 m)   Wt 140 lb (63.5 kg)   SpO2 100%   BMI 24.80 kg/m   General:   Alert,  Well-developed, well-nourished, pleasant and cooperative in NAD Lungs:  Clear  throughout to auscultation.   Heart:  Regular rate and rhythm; no murmurs, clicks, rubs,  or gallops. Abdomen:  Soft, nontender and nondistended. Normal bowel sounds.   Neuro/Psych:  Alert and cooperative. Normal mood and affect. A and O x 3   @Cheskel Silverio  Jessica Commander, MD, [facility] [phone] (pager) 10/27/2023 12:53 PM@

## 2023-10-27 ENCOUNTER — Encounter: Payer: Self-pay | Admitting: Internal Medicine

## 2023-10-27 ENCOUNTER — Ambulatory Visit (AMBULATORY_SURGERY_CENTER): Payer: 59 | Admitting: Internal Medicine

## 2023-10-27 VITALS — BP 117/76 | HR 67 | Temp 97.7°F | Resp 11 | Ht 63.0 in | Wt 140.0 lb

## 2023-10-27 DIAGNOSIS — D122 Benign neoplasm of ascending colon: Secondary | ICD-10-CM | POA: Diagnosis not present

## 2023-10-27 DIAGNOSIS — Z1211 Encounter for screening for malignant neoplasm of colon: Secondary | ICD-10-CM

## 2023-10-27 MED ORDER — SODIUM CHLORIDE 0.9 % IV SOLN: 500.0000 mL | Freq: Once | INTRAVENOUS | Status: DC

## 2023-10-27 NOTE — Patient Instructions (Addendum)
 I found and removed one tiny polyp.  I will let you know pathology results and when to have another routine colonoscopy by mail and/or My Chart.  All else normal.  I appreciate the opportunity to care for you. Lupita CHARLENA Commander, MD, [facility]  Discharge instructions given. Handout on polyps. Resume previous medications. YOU HAD AN ENDOSCOPIC PROCEDURE TODAY AT THE [facility]:   Refer to the procedure report that was given to you for any specific questions about what was found during the examination.  If the procedure report does not answer your questions, please call your gastroenterologist to clarify.  If you requested that your care partner not be given the details of your procedure findings, then the procedure report has been included in a sealed envelope for you to review at your convenience later.  YOU SHOULD EXPECT: Some feelings of bloating in the abdomen. Passage of more gas than usual.  Walking can help get rid of the air that was put into your GI tract during the procedure and reduce the bloating. If you had a lower endoscopy (such as a colonoscopy or flexible sigmoidoscopy) you may notice spotting of blood in your stool or on the toilet paper. If you underwent a bowel prep for your procedure, you may not have a normal bowel movement for a few days.  Please Note:  You might notice some irritation and congestion in your nose or some drainage.  This is from the oxygen used during your procedure.  There is no need for concern and it should clear up in a day or so.  SYMPTOMS TO REPORT IMMEDIATELY:  Following lower endoscopy (colonoscopy or flexible sigmoidoscopy):  Excessive amounts of blood in the stool  Significant tenderness or worsening of abdominal pains  Swelling of the abdomen that is new, acute  Fever of 100F or higher   For urgent or emergent issues, a gastroenterologist can be reached at any hour by calling [phone]. Do not use MyChart messaging for urgent  concerns.    DIET:  We do recommend a small meal at first, but then you may proceed to your regular diet.  Drink plenty of fluids but you should avoid alcoholic beverages for 24 hours.  ACTIVITY:  You should plan to take it easy for the rest of today and you should NOT DRIVE or use heavy machinery until tomorrow (because of the sedation medicines used during the test).    FOLLOW UP: Our staff will call the number listed on your records the next business day following your procedure.  We will call around 7:15- 8:00 am to check on you and address any questions or concerns that you may have regarding the information given to you following your procedure. If we do not reach you, we will leave a message.     If any biopsies were taken you will be contacted by phone or by letter within the next 1-3 weeks.  Please call us  at [phone] if you have not heard about the biopsies in 3 weeks.    SIGNATURES/CONFIDENTIALITY: You and/or your care partner have signed paperwork which will be entered into your electronic medical record.  These signatures attest to the fact that that the information above on your After Visit Summary has been reviewed and is understood.  Full responsibility of the confidentiality of this discharge information lies with you and/or your care-partner.

## 2023-10-27 NOTE — Progress Notes (Signed)
 Called to room to assist during endoscopic procedure.  Patient ID and intended procedure confirmed with present staff. Received instructions for my participation in the procedure from the performing physician.

## 2023-10-27 NOTE — Op Note (Signed)
 [facility] Patient Name: Jessica Acosta Procedure Date: 10/27/2023 12:51 PM MRN: [id number] Endoscopist: Lupita FORBES Commander , MD, [id number] Age: 48 Referring MD:  Date of Birth: [birth year] Gender: Female Account #: [id number] Procedure:                Colonoscopy Indications:              Screening for colorectal malignant neoplasm, This                            is the patient's first colonoscopy Medicines:                Monitored Anesthesia Care Procedure:                Pre-Anesthesia Assessment:                           - Prior to the procedure, a History and Physical                            was performed, and patient medications and                            allergies were reviewed. The patient's tolerance of                            previous anesthesia was also reviewed. The risks                            and benefits of the procedure and the sedation                            options and risks were discussed with the patient.                            All questions were answered, and informed consent                            was obtained. Prior Anticoagulants: The patient has                            taken no anticoagulant or antiplatelet agents. ASA                            Grade Assessment: II - A patient with mild systemic                            disease. After reviewing the risks and benefits,                            the patient was deemed in satisfactory condition to                            undergo the procedure.  After obtaining informed consent, the colonoscope                            was passed under direct vision. Throughout the                            procedure, the patient's blood pressure, pulse, and                            oxygen saturations were monitored continuously. The                            Olympus CF-HQ190L ([id number]) Colonoscope was                            introduced through the  anus and advanced to the the                            cecum, identified by appendiceal orifice and                            ileocecal valve. The colonoscopy was performed                            without difficulty. The patient tolerated the                            procedure well. The quality of the bowel                            preparation was excellent. The ileocecal valve,                            appendiceal orifice, and rectum were photographed.                            The bowel preparation used was SUPREP via split                            dose instruction. Scope In: 1:01:19 PM Scope Out: 1:15:52 PM Scope Withdrawal Time: 0 hours 10 minutes 44 seconds  Total Procedure Duration: 0 hours 14 minutes 33 seconds  Findings:                 The perianal and digital rectal examinations were                            normal.                           A 4 mm polyp was found in the ascending colon. The                            polyp was sessile. The polyp was removed with a  cold snare. Resection and retrieval were complete.                            Verification of patient identification for the                            specimen was done. Estimated blood loss was minimal.                           The exam was otherwise without abnormality on                            direct and retroflexion views. Complications:            No immediate complications. Estimated Blood Loss:     Estimated blood loss was minimal. Impression:               - One 4 mm polyp in the ascending colon, removed                            with a cold snare. Resected and retrieved.                           - The examination was otherwise normal on direct                            and retroflexion views. Recommendation:           - Patient has a contact number available for                            emergencies. The signs and symptoms of potential                             delayed complications were discussed with the                            patient. Return to normal activities tomorrow.                            Written discharge instructions were provided to the                            patient.                           - Resume previous diet.                           - Continue present medications.                           - Repeat colonoscopy is recommended. The                            colonoscopy date will be determined after pathology  results from today's exam become available for                            review. Lupita FORBES Commander, MD 10/27/2023 1:20:11 PM This report has been signed electronically.

## 2023-10-27 NOTE — Progress Notes (Signed)
 Pt's states no medical or surgical changes since previsit or office visit.

## 2023-10-27 NOTE — Progress Notes (Signed)
 Sedate, gd SR, tolerated procedure well, VSS, report to RN

## 2023-10-30 ENCOUNTER — Telehealth: Payer: Self-pay | Admitting: *Deleted

## 2023-10-30 NOTE — Telephone Encounter (Signed)
 Attempted post procedure follow up call.  No answer - LVM.

## 2023-11-01 LAB — SURGICAL PATHOLOGY

## 2023-11-02 ENCOUNTER — Encounter: Payer: Self-pay | Admitting: Internal Medicine

## 2023-11-02 DIAGNOSIS — Z860101 Personal history of adenomatous and serrated colon polyps: Secondary | ICD-10-CM

## 2023-11-02 HISTORY — DX: Personal history of adenomatous and serrated colon polyps: Z86.0101

## 2024-04-01 ENCOUNTER — Other Ambulatory Visit: Payer: Self-pay | Admitting: Family Medicine

## 2024-04-01 DIAGNOSIS — Z1231 Encounter for screening mammogram for malignant neoplasm of breast: Secondary | ICD-10-CM

## 2024-05-10 ENCOUNTER — Ambulatory Visit
Admission: RE | Admit: 2024-05-10 | Discharge: 2024-05-10 | Disposition: A | Discharge status: Home / Self Care | Source: Ambulatory Visit | Attending: Family Medicine | Admitting: Family Medicine

## 2024-05-10 DIAGNOSIS — Z1231 Encounter for screening mammogram for malignant neoplasm of breast: Secondary | ICD-10-CM

## 2024-05-15 ENCOUNTER — Ambulatory Visit: Payer: Self-pay | Admitting: Family Medicine

## 2024-06-18 ENCOUNTER — Ambulatory Visit (INDEPENDENT_AMBULATORY_CARE_PROVIDER_SITE_OTHER): Admitting: Obstetrics and Gynecology

## 2024-06-18 ENCOUNTER — Encounter: Payer: Self-pay | Admitting: Obstetrics and Gynecology

## 2024-06-18 VITALS — BP 142/96 | HR 80 | Ht 63.0 in | Wt 150.0 lb

## 2024-06-18 DIAGNOSIS — Z01419 Encounter for gynecological examination (general) (routine) without abnormal findings: Secondary | ICD-10-CM

## 2024-06-18 DIAGNOSIS — Z7689 Persons encountering health services in other specified circumstances: Secondary | ICD-10-CM

## 2024-06-18 NOTE — Progress Notes (Signed)
 Patient presents for Annual/Establish Care.  Elevated B/P today reading will usually be in 120's per pt  bottom will be in 80s being watched by PCP.  LMP: 05/29/2024 Last pap: 04/22/2022 Contraception: None partner has vasectomy  Mammogram: Up to date: 05/10/24 STD Screening: Declines   CC: Annual/None

## 2024-06-18 NOTE — Progress Notes (Signed)
 Obstetrics and Gynecology New Patient Evaluation  Appointment Date: 06/18/2024  OBGYN Clinic: [facility]   Primary Care Provider: Rilla Baller  Referring Provider: Self  Chief Complaint:  Chief Complaint  Patient presents with   Gynecologic Exam    History of Present Illness: Jessica Acosta is a 48 y.o.  [phone] (Patient's last menstrual period was 05/29/2024.), seen for the above chief complaint. Her past medical history is significant for h/o HTN  Patient here to establish care; she was previously followed by Dr. LaVoie with [facility], last visit 2024 and no issues.    No current problems or concerns.   Review of Systems: Pertinent items are noted in HPI.   Patient Active Problem List   Diagnosis Date Noted   Hx of adenomatous polyp of colon 11/02/2023   Scoliosis 09/04/2023   Leukocytosis 08/15/2023   Elevated blood pressure reading in office without diagnosis of hypertension 08/11/2023   Plantar wart of left foot 06/08/2021   Hyperlipidemia 05/04/2020   Menstrual migraine 05/04/2020   Chest discomfort 11/14/2017   Health care maintenance 11/28/2013   Past Medical History:  Past Medical History:  Diagnosis Date   Anxiety    History of chicken pox    Hx of adenomatous polyp of colon 11/02/2023   Hyperlipidemia    Migraines    Scoliosis    Past Surgical History:  Past Surgical History:  Procedure Laterality Date   mole removed from ear  10/17/1985   wisdom teeth removal     Past Obstetrical History:  OB History  Gravida Para Term Preterm AB Living  3 3 3   3   SAB IAB Ectopic Multiple Live Births      3    # Outcome Date GA Lbr Len/2nd Weight Sex Type Anes PTL Lv  3 Term 08/31/11 [redacted]w[redacted]d 01:19 / 00:02 7 lb 3.7 oz (3.28 kg) M Vag-Spont Local  LIV     Birth Comments: no anamolies noted  2 Term           1 Term             Obstetric Comments  Vaginal x 3   Past Gynecological History: As per HPI. Periods: qmonth, 28-31  days, 3 days, not heavy or painful History of Pap Smear(s): Yes.   Last pap July 2023, which was cytology negative, no hpv done She is currently using vasectomy for contraception.   Social History:  Social History   Socioeconomic History   Marital status: Married    Spouse name: Not on file   Number of children: Not on file   Years of education: Not on file   Highest education level: Master's degree (e.g., MA, MS, MEng, MEd, MSW, MBA)  Occupational History   Not on file  Tobacco Use   Smoking status: Former    Current packs/day: 0.00    Types: Cigarettes    Quit date: 11/17/2003    Years since quitting: 20.6   Smokeless tobacco: Never  Vaping Use   Vaping status: Never Used  Substance and Sexual Activity   Alcohol use: Yes    Comment: 2 glasses wine occasionally   Drug use: No   Sexual activity: Yes    Partners: Male    Comment: 1st intercourse- 17, partners- 5  Other Topics Concern   Not on file  Social History Narrative   Lives with husband Verdene Creson and 3 children, 1 dog, 1 cat   Occupation: Pensions consultant  Edu: master's degree   Activity: no regular exercise   Diet: good water, fruits/vegetables daily   Social Drivers of Health   Financial Resource Strain: Low Risk  (08/11/2023)   Overall Financial Resource Strain (CARDIA)    Difficulty of Paying Living Expenses: Not hard at all  Food Insecurity: No Food Insecurity (08/11/2023)   Hunger Vital Sign    Worried About Running Out of Food in the Last Year: Never true    Ran Out of Food in the Last Year: Never true  Transportation Needs: No Transportation Needs (08/11/2023)   PRAPARE - Administrator, Civil Service (Medical): No    Lack of Transportation (Non-Medical): No  Physical Activity: Insufficiently Active (08/11/2023)   Exercise Vital Sign    Days of Exercise per Week: 4 days    Minutes of Exercise per Session: 30 min  Stress: No Stress Concern Present (08/11/2023)   [employer] of Occupational Health - Occupational Stress Questionnaire    Feeling of Stress : Only a little  Social Connections: Unknown (08/11/2023)   Social Connection and Isolation Panel    Frequency of Communication with Friends and Family: More than three times a week    Frequency of Social Gatherings with Friends and Family: Three times a week    Attends Religious Services: Patient declined    Active Member of Clubs or Organizations: No    Attends Engineer, structural: Not on file    Marital Status: Married  Catering manager Violence: Not on file   Family History:  Family History  Problem Relation Age of Onset   Colon polyps Mother    Diabetes Mother    Colon polyps Father    Heart disease Father        pacer   Hypertension Father    Hyperlipidemia Father    Diabetes Father    Mitral valve prolapse Father 76   CAD Father 27       stent placement   Clotting disorder Sister        3 clots (ovarian, renal arteries)   Stroke Sister    Hypertension Sister    Colon polyps Brother    Breast cancer Maternal Aunt    Colon polyps Paternal Uncle    Arthritis Maternal Grandmother    Deep vein thrombosis Maternal Grandmother    Breast cancer Maternal Grandmother    CAD Maternal Grandfather 22       MI (smoker)   Diabetes Maternal Grandfather    Cancer Paternal Grandmother        breast   Stroke Paternal Grandmother    Heart disease Paternal Grandmother        CHF   Breast cancer Paternal Grandmother    Diabetes Paternal Grandmother    CAD Paternal Grandmother    Cancer Paternal Grandfather        prostate   Congestive Heart Failure Paternal Grandfather    Colon cancer Neg Hx    Esophageal cancer Neg Hx    Rectal cancer Neg Hx    Stomach cancer Neg Hx    Health Maintenance:  Mammogram(s): Yes.   Date: 04/2024 Colonoscopy: Yes.   Date: 10/2023  Medications Jessica Acosta had no medications administered during this visit. Current Outpatient Medications   Medication Sig Dispense Refill   hydrOXYzine  (ATARAX ) 25 MG tablet Take 0.5-1 tablets (12.5-25 mg total) by mouth 2 (two) times daily as needed for anxiety (sedation precautions). 30 tablet 0   Multiple Vitamin (  MULTIVITAMIN) tablet Take 1 tablet by mouth daily.     norethindrone  (MICRONOR ) 0.35 MG tablet Take 1 tablet (0.35 mg total) by mouth daily. 84 tablet 4   Probiotic Product (PROBIOTIC PO) Take by mouth. As directed     rosuvastatin  (CRESTOR ) 10 MG tablet Take 1 tablet (10 mg total) by mouth daily. 90 tablet 3   No current facility-administered medications for this visit.   Allergies Patient has no known allergies.  Physical Exam:  BP (!) 142/96   Pulse 80   Ht 5' 3 (1.6 m)   Wt 150 lb (68 kg)   LMP 05/29/2024   BMI 26.57 kg/m  Body mass index is 26.57 kg/m.  General appearance: Well nourished, well developed female in no acute distress.  Respiratory:  Clear to auscultation bilateral. Normal respiratory effort Neuro/Psych:  Normal mood and affect.   Laboratory: none  Radiology: none  Assessment: patient stable  Plan:  1. Encounter to establish care with new doctor (Primary) No current issues or concerns.   RTC 1 year for pap    Bebe Izell Raddle MD Attending [facility])

## 2024-07-10 ENCOUNTER — Telehealth: Payer: Self-pay

## 2024-07-10 DIAGNOSIS — E7841 Elevated Lipoprotein(a): Secondary | ICD-10-CM

## 2024-07-10 DIAGNOSIS — D72829 Elevated white blood cell count, unspecified: Secondary | ICD-10-CM

## 2024-07-10 NOTE — Addendum Note (Signed)
 Addended by: RILLA BALLER on: 07/10/2024 05:43 PM   Modules accepted: Orders

## 2024-07-10 NOTE — Telephone Encounter (Signed)
 Copied from CRM #[id number]. Topic: Clinical - Request for Lab/Test Order >> Jul 09, 2024 11:30 AM Pinkey ORN wrote: Reason for CRM: Lab Request >> Jul 09, 2024 11:32 AM Pinkey ORN wrote: Patient recently had her physical completed at the gynecologist office. Patient is requesting a full blood panel work up. Please follow up with patient.

## 2024-07-10 NOTE — Telephone Encounter (Addendum)
 Will her insurance only cover 1 physical /calendar year?  I would still want to see her in the office - recommend she schedule f/u office visit for hyperlipidemia. I have ordered labs.

## 2024-07-12 ENCOUNTER — Other Ambulatory Visit (INDEPENDENT_AMBULATORY_CARE_PROVIDER_SITE_OTHER)

## 2024-07-12 DIAGNOSIS — D72829 Elevated white blood cell count, unspecified: Secondary | ICD-10-CM

## 2024-07-12 DIAGNOSIS — E7841 Elevated Lipoprotein(a): Secondary | ICD-10-CM

## 2024-07-12 LAB — CBC WITH DIFFERENTIAL/PLATELET
Absolute Lymphocytes: 3497 {cells}/uL (ref 850–3900)
Absolute Monocytes: 798 {cells}/uL (ref 200–950)
Basophils Absolute: 32 {cells}/uL (ref 0–200)
Basophils Relative: 0.3 %
Eosinophils Absolute: 21 {cells}/uL (ref 15–500)
Eosinophils Relative: 0.2 %
HCT: 37.6 % (ref 35.0–45.0)
Hemoglobin: 12.9 g/dL (ref 11.7–15.5)
MCH: 30.2 pg (ref 27.0–33.0)
MCHC: 34.3 g/dL (ref 32.0–36.0)
MCV: 88.1 fL (ref 80.0–100.0)
MPV: 9.5 fL (ref 7.5–12.5)
Monocytes Relative: 7.6 %
Neutro Abs: 6153 {cells}/uL (ref 1500–7800)
Neutrophils Relative %: 58.6 %
Platelets: 339 Thousand/uL (ref 140–400)
RBC: 4.27 Million/uL (ref 3.80–5.10)
RDW: 12.4 % (ref 11.0–15.0)
Total Lymphocyte: 33.3 %
WBC: 10.5 Thousand/uL (ref 3.8–10.8)

## 2024-07-12 LAB — LIPID PANEL
Cholesterol: 155 mg/dL (ref <200)
HDL: 66 mg/dL (ref >=50)
LDL Cholesterol (Calc): 73 mg/dL
Non-HDL Cholesterol (Calc): 89 mg/dL (ref <130)
Total CHOL/HDL Ratio: 2.3 (calc) (ref <5.0)
Triglycerides: 79 mg/dL (ref <150)

## 2024-07-12 LAB — BASIC METABOLIC PANEL WITH GFR
BUN: 9 mg/dL (ref 7–25)
CO2: 25 mmol/L (ref 20–32)
Calcium: 9.3 mg/dL (ref 8.6–10.2)
Chloride: 102 mmol/L (ref 98–110)
Creat: 0.69 mg/dL (ref 0.50–0.99)
Glucose, Bld: 85 mg/dL (ref 65–99)
Potassium: 4.2 mmol/L (ref 3.5–5.3)
Sodium: 136 mmol/L (ref 135–146)
eGFR: 108 mL/min/1.73m2 (ref >=60)

## 2024-07-12 NOTE — Addendum Note (Signed)
 Addended by: ISADORA RAISIN on: 07/12/2024 03:12 PM   Modules accepted: Orders

## 2024-07-12 NOTE — Telephone Encounter (Signed)
 Called patient set up lab app.

## 2024-07-16 ENCOUNTER — Encounter: Payer: Self-pay | Admitting: Family Medicine

## 2024-07-16 ENCOUNTER — Ambulatory Visit: Admitting: Family Medicine

## 2024-07-16 VITALS — BP 136/86 | HR 72 | Temp 97.8°F | Ht 62.0 in | Wt 144.4 lb

## 2024-07-16 DIAGNOSIS — R030 Elevated blood-pressure reading, without diagnosis of hypertension: Secondary | ICD-10-CM

## 2024-07-16 DIAGNOSIS — D72829 Elevated white blood cell count, unspecified: Secondary | ICD-10-CM

## 2024-07-16 DIAGNOSIS — Z23 Encounter for immunization: Secondary | ICD-10-CM | POA: Diagnosis not present

## 2024-07-16 DIAGNOSIS — E7841 Elevated Lipoprotein(a): Secondary | ICD-10-CM | POA: Diagnosis not present

## 2024-07-16 MED ORDER — ROSUVASTATIN CALCIUM 10 MG PO TABS: 10.0000 mg | ORAL_TABLET | Freq: Every day | ORAL | 3 refills | Status: AC

## 2024-07-16 NOTE — Assessment & Plan Note (Signed)
 Chronic, stable period on rosuvastatin  10mg  daliy. 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  The 10-year ASCVD risk score (Arnett DK, et al., 2019) is: 0.5%   Values used to calculate the score:     Age: 48 years     Clincally relevant sex: Female     Is Non-Hispanic African American: No     Diabetic: No     Tobacco smoker: No     Systolic Blood Pressure: 136 mmHg     Is BP treated: No     HDL Cholesterol: 66 mg/dL     Total Cholesterol: 155 mg/dL

## 2024-07-16 NOTE — Assessment & Plan Note (Signed)
 This has improved on latest labwork.

## 2024-07-16 NOTE — Assessment & Plan Note (Addendum)
 BP well controlled in office today.  She notes home readings run 110s/80-90s, discussed borderline diastolic readings.  Will continue low salt/sodium diet and continue monitoring BP at home, notify me if trending up.

## 2024-07-16 NOTE — Assessment & Plan Note (Deleted)
 Preventative protocols reviewed and updated unless pt declined. Discussed healthy diet and lifestyle.

## 2024-07-16 NOTE — Progress Notes (Signed)
 Ph: [phone] Fax: [phone]   Patient ID: Jessica Acosta, female    DOB: January 27, 1976, 48 y.o.   MRN: [id number]  This visit was conducted in person.  BP 136/86   Pulse 72   Temp 97.8 F (36.6 C) (Oral)   Ht 5' 2 (1.575 m)   Wt 144 lb 6 oz (65.5 kg)   LMP 06/26/2024   SpO2 96%   BMI 26.41 kg/m    CC: med f/u visit  Subjective:   HPI: Jessica Acosta is a 48 y.o. female presenting on 07/16/2024 for Medical Management of Chronic Issues (Medication f/u. ) and Annual Exam   Saw GYN for well woman exam/physical.   HLD with elevated Lp(a) 132. Saw cardiology 2024 s/p reassuring workup including cardiac CT with coronary morphology with calcium  score of zero (12/2022)  BC - husband had vasectomy.   Home BP readings have been running  <120/80-90.  Preventative: Colonoscopy 10/223 - 4mm TA, rpt 10 yrs Ollen)  Well woman yearly with OBGYN Dr. Lavoie --> Dr Izell, last seen 06/2024. All normal paps, latest 04/2022 with rpt planned 2026. OCP Micronor  through GYN - planning to stop once husband has vasectomy.  LMP - 06/26/2024 and regular  M[facility]/2024 [facility]  Flu shot - yearly  COVID vaccine - Pfizer 12/2019 x2, booster x1  Tdap 08/2011 , rpt 07/2023  Seat belt use discussed Sunscreen use discussed. No changing moles on skin Averaging 6-8 hours of sleep/day  Ex-smoker - quit 2005  Alcohol - social on weekends Dentist q6 mo Eye exam yearly   1 cup coffee in am Lives with husband Xia Stohr and 3 children, 1 dog, 1 cat  Occupation: Paramedic - curriculum facilitator  Edu: master's degree  Activity: enjoys tennis, elliptical and weight machines at [employer]  Diet: good water, fruits/vegetables daily      Relevant past medical, surgical, family and social history reviewed and updated as indicated. Interim medical history since our last visit reviewed. Allergies and medications reviewed and updated. Outpatient  Medications Prior to Visit  Medication Sig Dispense Refill   APPLE CIDER VINEGAR PO Take by mouth daily.     hydrOXYzine  (ATARAX ) 25 MG tablet Take 0.5-1 tablets (12.5-25 mg total) by mouth 2 (two) times daily as needed for anxiety (sedation precautions). 30 tablet 0   MAGNESIUM COMPLEX PO Take by mouth.     Multiple Vitamin (MULTIVITAMIN) tablet Take 1 tablet by mouth daily.     Probiotic Product (PROBIOTIC PO) Take by mouth. As directed     norethindrone  (MICRONOR ) 0.35 MG tablet Take 1 tablet (0.35 mg total) by mouth daily. 84 tablet 4   rosuvastatin  (CRESTOR ) 10 MG tablet Take 1 tablet (10 mg total) by mouth daily. 90 tablet 3   No facility-administered medications prior to visit.     Per HPI unless specifically indicated in ROS section below Review of Systems  Constitutional:  Negative for activity change, appetite change, chills, fatigue, fever and unexpected weight change.  HENT:  Negative for hearing loss.   Eyes:  Negative for visual disturbance.  Respiratory:  Negative for cough, chest tightness, shortness of breath and wheezing.   Cardiovascular:  Negative for chest pain, palpitations and leg swelling.  Gastrointestinal:  Negative for abdominal distention, abdominal pain, blood in stool, constipation, diarrhea, nausea and vomiting.  Genitourinary:  Negative for difficulty urinating and hematuria.  Musculoskeletal:  Negative for arthralgias, myalgias and neck pain.  Skin:  Negative for rash.  Neurological:  Negative for dizziness, seizures, syncope and headaches.  Hematological:  Negative for adenopathy. Does not bruise/bleed easily.  Psychiatric/Behavioral:  Negative for dysphoric mood. The patient is not nervous/anxious.     Objective:  BP 136/86   Pulse 72   Temp 97.8 F (36.6 C) (Oral)   Ht 5' 2 (1.575 m)   Wt 144 lb 6 oz (65.5 kg)   LMP 06/26/2024   SpO2 96%   BMI 26.41 kg/m   Wt Readings from Last 3 Encounters:  07/16/24 144 lb 6 oz (65.5 kg)  06/18/24 150  lb (68 kg)  10/27/23 140 lb (63.5 kg)      Physical Exam Vitals and nursing note reviewed.  Constitutional:      Appearance: Normal appearance. She is not ill-appearing.  HENT:     Head: Normocephalic and atraumatic.     Right Ear: Tympanic membrane, ear canal and external ear normal. There is no impacted cerumen.     Left Ear: Tympanic membrane, ear canal and external ear normal. There is no impacted cerumen.     Mouth/Throat:     Mouth: Mucous membranes are moist.     Pharynx: Oropharynx is clear. No oropharyngeal exudate or posterior oropharyngeal erythema.  Eyes:     General:        Right eye: No discharge.        Left eye: No discharge.     Extraocular Movements: Extraocular movements intact.     Conjunctiva/sclera: Conjunctivae normal.     Pupils: Pupils are equal, round, and reactive to light.  Neck:     Thyroid : No thyroid  mass or thyromegaly.  Cardiovascular:     Rate and Rhythm: Normal rate and regular rhythm.     Pulses: Normal pulses.     Heart sounds: Normal heart sounds. No murmur heard. Pulmonary:     Effort: Pulmonary effort is normal. No respiratory distress.     Breath sounds: Normal breath sounds. No wheezing, rhonchi or rales.  Abdominal:     General: Bowel sounds are normal. There is no distension.     Palpations: Abdomen is soft. There is no mass.     Tenderness: There is no abdominal tenderness. There is no guarding or rebound.     Hernia: No hernia is present.  Musculoskeletal:     Cervical back: Normal range of motion and neck supple. No rigidity.     Right lower leg: No edema.     Left lower leg: No edema.  Lymphadenopathy:     Cervical: No cervical adenopathy.  Skin:    General: Skin is warm and dry.     Findings: No rash.  Neurological:     General: No focal deficit present.     Mental Status: She is alert. Mental status is at baseline.  Psychiatric:        Mood and Affect: Mood normal.        Behavior: Behavior normal.       Results  for orders placed or performed in visit on 07/12/24  Lipid panel   Collection Time: 07/12/24  3:12 PM  Result Value Ref Range   Cholesterol 155 <200 mg/dL   HDL 66 > OR = 50 mg/dL   Triglycerides 79 <849 mg/dL   LDL Cholesterol (Calc) 73 mg/dL (calc)   Total CHOL/HDL Ratio 2.3 <5.0 (calc)   Non-HDL Cholesterol (Calc) 89 <869 mg/dL (calc)  Basic metabolic panel with GFR   Collection Time: 07/12/24  3:12 PM  Result Value Ref Range   Glucose, Bld 85 65 - 99 mg/dL   BUN 9 7 - 25 mg/dL   Creat 9.30 9.49 - 9.00 mg/dL   eGFR 891 > OR = 60 fO/fpw/8.26f7   BUN/Creatinine Ratio SEE NOTE: 6 - 22 (calc)   Sodium 136 135 - 146 mmol/L   Potassium 4.2 3.5 - 5.3 mmol/L   Chloride 102 98 - 110 mmol/L   CO2 25 20 - 32 mmol/L   Calcium  9.3 8.6 - 10.2 mg/dL  CBC with Differential/Platelet   Collection Time: 07/12/24  3:12 PM  Result Value Ref Range   WBC 10.5 3.8 - 10.8 Thousand/uL   RBC 4.27 3.80 - 5.10 Million/uL   Hemoglobin 12.9 11.7 - 15.5 g/dL   HCT 62.3 64.9 - 54.9 %   MCV 88.1 80.0 - 100.0 fL   MCH 30.2 27.0 - 33.0 pg   MCHC 34.3 32.0 - 36.0 g/dL   RDW 87.5 88.9 - 84.9 %   Platelets 339 140 - 400 Thousand/uL   MPV 9.5 7.5 - 12.5 fL   Neutro Abs 6,153 1,500 - 7,800 cells/uL   Absolute Lymphocytes 3,497 850 - 3,900 cells/uL   Absolute Monocytes 798 200 - 950 cells/uL   Eosinophils Absolute 21 15 - 500 cells/uL   Basophils Absolute 32 0 - 200 cells/uL   Neutrophils Relative % 58.6 %   Total Lymphocyte 33.3 %   Monocytes Relative 7.6 %   Eosinophils Relative 0.2 %   Basophils Relative 0.3 %    Assessment & Plan:   Problem List Items Addressed This Visit     Hyperlipidemia - Primary   Chronic, stable period on rosuvastatin  10mg  daliy. 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  The 10-year ASCVD risk score (Arnett DK, et al., 2019) is: 0.5%   Values used to calculate the score:     Age: 45 years     Clincally relevant sex: Female     Is Non-Hispanic African  American: No     Diabetic: No     Tobacco smoker: No     Systolic Blood Pressure: 136 mmHg     Is BP treated: No     HDL Cholesterol: 66 mg/dL     Total Cholesterol: 155 mg/dL       Relevant Medications   rosuvastatin  (CRESTOR ) 10 MG tablet   Elevated blood pressure reading in office without diagnosis of hypertension   BP well controlled in office today.  She notes home readings run 110s/80-90s, discussed borderline diastolic readings.  Will continue low salt/sodium diet and continue monitoring BP at home, notify me if trending up.       Leukocytosis   This has improved on latest labwork.       Other Visit Diagnoses       Need for influenza vaccination       Relevant Orders   Flu vaccine trivalent PF, 6mos and older(Flulaval,Afluria,Fluarix,Fluzone) (Completed)        Meds ordered this encounter  Medications   rosuvastatin  (CRESTOR ) 10 MG tablet    Sig: Take 1 tablet (10 mg total) by mouth daily.    Dispense:  90 tablet    Refill:  3    Orders Placed This Encounter  Procedures   Flu vaccine trivalent PF, 6mos and older(Flulaval,Afluria,Fluarix,Fluzone)    Patient Instructions  Flu shot today  Continue crestor  - refilled today  Good to see you today Return as needed or in 1  year for next physical   Follow up plan: Return in about 1 year (around 07/16/2025) for annual exam, prior fasting for blood work.  Anton Blas, MD

## 2024-07-16 NOTE — Patient Instructions (Addendum)
 Flu shot today  Continue crestor  - refilled today  Good to see you today Return as needed or in 1 year for next physical
